# Patient Record
Sex: Female | Born: 1935 | Race: White | Hispanic: No | Smoking: Never smoker
Health system: Southern US, Community
[De-identification: ages and names within clinical notes are randomized; demographics above are authoritative.]

## PROBLEM LIST (undated history)

## (undated) DIAGNOSIS — I5030 Unspecified diastolic (congestive) heart failure: Secondary | ICD-10-CM

## (undated) DIAGNOSIS — I10 Essential (primary) hypertension: Secondary | ICD-10-CM

## (undated) DIAGNOSIS — F0280 Dementia in other diseases classified elsewhere without behavioral disturbance: Secondary | ICD-10-CM

## (undated) DIAGNOSIS — F0390 Unspecified dementia without behavioral disturbance: Secondary | ICD-10-CM

---

## 2014-04-24 ENCOUNTER — Emergency Department (HOSPITAL_COMMUNITY): Payer: Medicare Other

## 2014-04-24 ENCOUNTER — Encounter (HOSPITAL_COMMUNITY): Payer: Self-pay | Admitting: Emergency Medicine

## 2014-04-24 ENCOUNTER — Emergency Department (HOSPITAL_COMMUNITY)
Admission: EM | Admit: 2014-04-24 | Discharge: 2014-04-24 | Disposition: A | Discharge status: Home / Self Care | Payer: Medicare Other | Attending: Emergency Medicine | Admitting: Emergency Medicine

## 2014-04-24 DIAGNOSIS — R51 Headache: Secondary | ICD-10-CM | POA: Diagnosis not present

## 2014-04-24 DIAGNOSIS — W010XXA Fall on same level from slipping, tripping and stumbling without subsequent striking against object, initial encounter: Secondary | ICD-10-CM | POA: Insufficient documentation

## 2014-04-24 DIAGNOSIS — Z79899 Other long term (current) drug therapy: Secondary | ICD-10-CM | POA: Diagnosis not present

## 2014-04-24 DIAGNOSIS — Y9301 Activity, walking, marching and hiking: Secondary | ICD-10-CM | POA: Diagnosis not present

## 2014-04-24 DIAGNOSIS — S0990XA Unspecified injury of head, initial encounter: Secondary | ICD-10-CM | POA: Diagnosis not present

## 2014-04-24 DIAGNOSIS — R404 Transient alteration of awareness: Secondary | ICD-10-CM | POA: Diagnosis not present

## 2014-04-24 DIAGNOSIS — Y929 Unspecified place or not applicable: Secondary | ICD-10-CM | POA: Diagnosis not present

## 2014-04-24 DIAGNOSIS — S0003XA Contusion of scalp, initial encounter: Secondary | ICD-10-CM | POA: Diagnosis not present

## 2014-04-24 DIAGNOSIS — S1093XA Contusion of unspecified part of neck, initial encounter: Secondary | ICD-10-CM | POA: Diagnosis not present

## 2014-04-24 DIAGNOSIS — R42 Dizziness and giddiness: Secondary | ICD-10-CM | POA: Diagnosis not present

## 2014-04-24 NOTE — ED Notes (Signed)
Pt BIB EMS. Pt is from [place]. Pt states she slipped and fell on linoleum floor. Pt hit her head on the floor. Pt has small hematoma to R side of head. Pt states she had a headache earlier, but has resolved now. Pt c/o slight nausea with standing. Pt a/o x 4. No other injuries reported.

## 2014-04-24 NOTE — ED Notes (Signed)
Bed: WE99 Expected date: 04/24/14 Expected time: 4:19 PM Means of arrival: Ambulance Comments: Fall, bumped back of head No LOC

## 2014-04-24 NOTE — Discharge Instructions (Signed)
Return to the ED with any concerns including vomiting, seizure activity, decreased level of alertness/lethargy, or any other alarming symptoms   Emergency Department Resource Guide 1) Find a Doctor and Pay Out of Pocket Although you won't have to find out who is covered by your insurance plan, it is a good idea to ask around and get recommendations. You will then need to call the office and see if the doctor you have chosen will accept you as a new patient and what types of options they offer for patients who are self-pay. Some doctors offer discounts or will set up payment plans for their patients who do not have insurance, but you will need to ask so you aren't surprised when you get to your appointment.  2) Contact Your Local Health Department Not all health departments have doctors that can see patients for sick visits, but many do, so it is worth a call to see if yours does. If you don't know where your local health department is, you can check in your phone book. The CDC also has a tool to help you locate your state's health department, and many state websites also have listings of all of their local health departments.  3) Find a [facility] If your illness is not likely to be very severe or complicated, you may want to try a walk in clinic. These are popping up all over the country in pharmacies, drugstores, and shopping centers. They're usually staffed by nurse practitioners or physician assistants that have been trained to treat common illnesses and complaints. They're usually fairly quick and inexpensive. However, if you have serious medical issues or chronic medical problems, these are probably not your best option.  No Primary Care Doctor: - Call Health Connect at  [phone] - they can help you locate a primary care doctor that  accepts your insurance, provides certain services, etc. - Physician Referral Service- [phone]  Chronic Pain Problems: Organization          Address  Phone   Notes  [facility]  [phone] Patients need to be referred by their primary care doctor.   Medication Assistance: Organization         Address  Phone   Notes  [facility] [address] [place], [place] [address] [phone] --Must be a resident of [facility] -- Must have NO insurance coverage whatsoever (no Medicaid/ Medicare, etc.) -- The pt. MUST have a primary care doctor that directs their care regularly and follows them in the community   [facility]ist  [phone]   [employer]  [phone]    Agencies that provide inexpensive medical care: Organization         Address  Phone   Notes  [place]  [phone]   [facility]    [phone]   [facility] [place], [place] [address] [phone]   [place] [address], [place] [phone]   Planned Parenthood    [phone]   [facility]    [phone]   [place] and [address], [place] Phone:  [phone], Fax:  [phone] Hours of Operation:  9 am - 6 pm, M-F.  Also accepts Medicaid/Medicare and self-pay.  [facility] for [place] [address], [place] Phone: [phone], Fax: [phone])  [phone]. Hours of Operation:  8:30 am - 5:30 pm, M-F.  Also accepts Medicaid and self-pay.  [facility] [address] [place] Phone: [phone]   [place], [place], [place] [phone], Ext. [phone] Mondays & Thursdays: 7-9 AM.  First 15 patients are seen on a first come, first serve basis.    [place] Providers:  Organization         Address  Phone   Notes  [facility] [address], [place] [phone] Also accepts self-pay patients.  [facility] [address], [place]  [phone]   [place], [address], [place] [phone]   [facility] [address] [place] [phone]   Lucianne Lei [address], [place]   [phone] Only accepts [place] Access [place] patients after they have their name applied to their card.   Self-Pay (no insurance) in [facility]:  Organization         Address  Phone   Notes  Sickle Cell Patients, [facility] [place] [phone]   [facility] [phone]   [facility] [place]  [place], [place], [place] [phone]   [facility]/Dr. Osei-Bonsu  [address] [place] or [address], [place] [phone] Phone number for both [place] and [place] locations is the same.  [facility] [address], [place] [phone]   [facility] [address] [place] or [address] [phone]   [facility] [address], [place] [phone], phone; [phone], fax Sees patients 1st and 3rd Saturday of every month.  Must not qualify for public or private insurance (i.e. Medicaid, Medicare, [place] Health Choice, Veterans' Benefits)  Household income should be no more than 200% of the poverty level The clinic cannot treat you if you are pregnant or think you are pregnant  Sexually transmitted diseases are not treated at the clinic.    Dental Care: Organization         Address  Phone  Notes  [facility] Department of [facility] [place] [phone] Accepts children up to age 48 who are enrolled in [place] or [place]; pregnant women with a Medicaid card; and  children who have applied for Medicaid or [place] Health Choice, but were declined, whose parents can pay a reduced fee at time of service.  [facility] Department of [facility]  [address], [place] [phone] Accepts children up to age 27 who are enrolled in [place] or [place]; pregnant women with a Medicaid card; and children who have applied for Medicaid or [place] Health Choice, but were declined, whose parents can pay a reduced fee at time of service.  [place] Adult [facility] Access PROGRAM  [place] [phone] Patients are seen by appointment only. Walk-ins are not accepted. [place] will see patients 50 years of age and older. Monday - Tuesday (8am-5pm) Most Wednesdays (8:30-5pm) $30 per visit, cash only  [facility] Adult [employer] PROGRAM  [address], [place] [phone]  Patients are seen by appointment only. Walk-ins are not accepted. [place] will see patients 8 years of age and older. One Wednesday Evening (Monthly: Volunteer Based).  $30 per visit, cash only  [place]  [phone] for adults; Children under age 70, call Graduate Pediatric Dentistry at [phone]. Children aged 44-14, please call [phone] to request a pediatric application.  Dental services are provided in all areas of dental care including fillings, crowns and bridges, complete and partial dentures, implants, gum treatment, root canals, and extractions. Preventive care is also provided. Treatment is provided to both adults and children. Patients are selected via a lottery and there is often a waiting list.   [facility] [address] [place]  [phone] [email or web address]   [facility] [address] [place], [place] [phone], Ext. [phone] Second and Fourth Thursday of each month, opens at 6:30 AM; Clinic ends at 9 AM.  Patients are seen on a first-come first-served  basis, and a limited number are seen during each clinic.   [facility]  [address] [place] Danker [place], [place] [phone]   Eligibility Requirements You must have lived in [place], [place], or [place] counties for at least the last three months.   You cannot be eligible for state or federal sponsored [employer], including [employer], [place], or [employer].   You generally cannot be eligible for healthcare insurance through your employer.    How to apply: Eligibility screenings are held every Tuesday and Wednesday afternoon from 1:00 pm until 4:00 pm. You do not need an appointment for the interview!  [facility] [address], [place], [place]   [place]  [place]end Department  [place]  [phone]    Behavioral Health Resources in the Community: Intensive Outpatient Programs Organization         Address  Phone  Notes  [facility]. [address], [place], [place] [phone]   [facility] [address] [place], [place]   ADS: Alcohol & Drug Svcs [address] [place], [place]   [place] [address]  [place], [place] or [phone]   Substance Abuse Resources Organization         Address  Phone  Notes  Alcohol and Drug Services  [phone]   [place]  [phone]   [facility] [place]   Chinita Pester  [phone]   Residential & Outpatient Substance Abuse Program  [phone]   Psychological Services Organization         Address  Phone  Notes  [facility] [place]  Reserve  [phone]   [place] [address], [place] or [phone]    Mobile Crisis Teams Organization          Address  Phone  Notes  [facility], Mobile Crisis Care Unit  [phone]   [facility]otherapeutic [facility]  [address] [place], [place]   [place] [address], [place] [phone]    Self-Help/Support Groups Organization         Address  Phone             Notes  [place]. [facility] - variety of support groups  [place]  Call for more information  Narcotics Anonymous (NA), [facility] [address], [employer] [place]  2 meetings at this location   Residential Facilities manager         Address  Phone  Notes  [facility] [place],    [place]  [phone]   [facility]  [address] [place] [phone], [place], [place]   [place], [place] [phone] Admissions: 8am-3pm M-F  Incentives Substance [place] [address]    [place], [place] [phone]   [facility] [address] [place], [place], [place]   [facility] [address]  [place], [place]   [facility] Programs - Intensive Outpatient [address], [place], [place]   [facility] ([place].,  [place], [place] [phone] or [phone]   [facility] (RTS) [address], [place], [place] Accepts Medicaid  [facility] [place] [address],  [place] [phone] Substance Abuse/Addiction Treatment   [facility] Organization         Address  Phone  Notes  [facility]  [phone]   Domenic Schwab, PhD [address] [place]lis [place], [place]   [phone] or [phone]   [place], [place] [phone]   [facility] [address] [place], [place] [phone] Insurance/Medicaid/sponsorship  through [facility] Families [address], [address] [place]                                    [place], [place] [phone] [place] [address], [place] [phone]    Dr. Adele Schilder  [phone]   [facility] [place] Dept. 1) [address] [place] 2) [place] 3)  [place] of [place] [address], [place] [phone]  [phone]   [place] [phone] or [phone] (After Hours)

## 2014-04-24 NOTE — ED Provider Notes (Signed)
CSN: [id number]     Arrival date & time 04/24/14  1626 History   First MD Initiated Contact with Patient 04/24/14 1627     Chief Complaint  Patient presents with  . Fall  . Head Injury     (Consider location/radiation/quality/duration/timing/severity/associated sxs/prior Treatment) HPI Pt presents after slip and fall with minor head injury.  Pt states that she was walking and slipped on a wet floor at her facility.  She states she fell backwards and hit the back of her head.  No LOC, she has complete memory of the fall.  She c/o mild headache at this time. No nausea or vomiting, no seizure activity.  No pain in neck or extremities.  She has been able to ambulate normally since teh fall.  No chest pain or lightheadedness preceded the fall. There are no other associated systemic symptoms, there are no other alleviating or modifying factors.   History reviewed. No pertinent past medical history. History reviewed. No pertinent past surgical history. No family history on file. History  Substance Use Topics  . Smoking status: Not on file  . Smokeless tobacco: Not on file  . Alcohol Use: Not on file   OB History   Grav Para Term Preterm Abortions TAB SAB Ect Mult Living                 Review of Systems ROS reviewed and all otherwise negative except for mentioned in HPI    Allergies  Review of patient's allergies indicates no known allergies.  Home Medications   Prior to Admission medications   Medication Sig Start Date End Date Taking? Authorizing Provider  loratadine (CLARITIN) 10 MG tablet Take 10 mg by mouth daily.   Yes Historical Provider, MD  Multiple Vitamin (MULTIVITAMIN WITH MINERALS) TABS tablet Take 1 tablet by mouth daily.   Yes Historical Provider, MD  oxybutynin (DITROPAN) 5 MG tablet Take 5 mg by mouth 2 (two) times daily.   Yes Historical Provider, MD   BP 138/83  Pulse 67  Temp(Src) 98.3 F (36.8 C) (Oral)  Resp 16  SpO2 100% Vitals reviewed Physical  Exam Physical Examination: General appearance - alert, well appearing, and in no distress Mental status - alert, oriented to person, place, and time Head- NCAT, no hematoma/abrasion or laceration to scalp Eyes - no conjunctival injection, no scleral icterus Mouth - mucous membranes moist, pharynx normal without lesions Neck - no midline tenderness to palpation, FROM without pain Chest - clear to auscultation, no wheezes, rales or rhonchi, symmetric air entry Heart - normal rate, regular rhythm, normal S1, S2, no murmurs, rubs, clicks or gallops Abdomen - soft, nontender, nondistended, no masses or organomegaly Neurological - alert, oriented x 3, normal speech, cranial nerves 2-12 tested and intact, strength 5/5 in extremities x 4, sensation intact Extremities - peripheral pulses normal, no pedal edema, no clubbing or cyanosis Skin - normal coloration and turgor, no rashes  ED Course  Procedures (including critical care time) Labs Review Labs Reviewed - No data to display  Imaging Review Ct Head Wo Contrast  04/24/2014   CLINICAL DATA:  Fall. Head injury. Small right-sided hematoma on head. Slight nausea. Prior headache, since resolved.  EXAM: CT HEAD WITHOUT CONTRAST  TECHNIQUE: Contiguous axial images were obtained from the base of the skull through the vertex without intravenous contrast.  COMPARISON:  None.  FINDINGS: There is no evidence of acute cortical infarct, intracranial hemorrhage, mass, midline shift, or extra-axial fluid collection. There is mild-to-moderate generalized  cerebral atrophy. Periventricular white-matter hypodensities are nonspecific but compatible with mild chronic small vessel ischemic disease.  Prior bilateral cataract extraction is noted. Mastoid air cells are clear. No skull fracture is identified. Moderate circumferential left sphenoid sinus mucosal thickening and rightward nasal septal deviation and spurring are noted.  IMPRESSION: 1. No acute intracranial  abnormality. 2. Mild chronic small vessel ischemic disease and cerebral atrophy.   Electronically Signed   By: Logan Bores   On: 04/24/2014 17:54     EKG Interpretation None      MDM   Final diagnoses:  Minor head injury, initial encounter    Pt presenting with headache after mechanical fall, slipped and hit head falling backwards.  No neck pain or tenderness.  Pt with normal neuro exam. Not on blood thinners.  Head CT reassuring.    Nursing notes including past medical history and social history reviewed and considered in documentation Prior records reviewed and considered during this visit     Threasa Beards, MD 04/24/14 2022

## 2014-04-24 NOTE — ED Notes (Signed)
Patient transported to CT 

## 2014-07-12 DIAGNOSIS — R350 Frequency of micturition: Secondary | ICD-10-CM | POA: Diagnosis not present

## 2014-07-12 DIAGNOSIS — Z538 Procedure and treatment not carried out for other reasons: Secondary | ICD-10-CM | POA: Diagnosis not present

## 2014-07-12 DIAGNOSIS — G301 Alzheimer's disease with late onset: Secondary | ICD-10-CM | POA: Diagnosis not present

## 2014-07-12 DIAGNOSIS — R51 Headache: Secondary | ICD-10-CM | POA: Diagnosis not present

## 2014-07-21 DIAGNOSIS — I10 Essential (primary) hypertension: Secondary | ICD-10-CM | POA: Diagnosis not present

## 2014-07-21 DIAGNOSIS — Z1389 Encounter for screening for other disorder: Secondary | ICD-10-CM | POA: Diagnosis not present

## 2014-07-21 DIAGNOSIS — N8110 Cystocele, unspecified: Secondary | ICD-10-CM | POA: Diagnosis not present

## 2014-07-28 DIAGNOSIS — G301 Alzheimer's disease with late onset: Secondary | ICD-10-CM | POA: Diagnosis not present

## 2014-07-28 DIAGNOSIS — N819 Female genital prolapse, unspecified: Secondary | ICD-10-CM | POA: Diagnosis not present

## 2014-07-28 DIAGNOSIS — N952 Postmenopausal atrophic vaginitis: Secondary | ICD-10-CM | POA: Diagnosis not present

## 2014-07-28 DIAGNOSIS — N8110 Cystocele, unspecified: Secondary | ICD-10-CM | POA: Diagnosis not present

## 2014-08-05 DIAGNOSIS — N8111 Cystocele, midline: Secondary | ICD-10-CM | POA: Diagnosis not present

## 2014-08-05 DIAGNOSIS — N393 Stress incontinence (female) (male): Secondary | ICD-10-CM | POA: Diagnosis not present

## 2014-08-05 DIAGNOSIS — N3281 Overactive bladder: Secondary | ICD-10-CM | POA: Diagnosis not present

## 2014-08-24 DIAGNOSIS — R32 Unspecified urinary incontinence: Secondary | ICD-10-CM | POA: Diagnosis not present

## 2014-08-24 DIAGNOSIS — G301 Alzheimer's disease with late onset: Secondary | ICD-10-CM | POA: Diagnosis not present

## 2014-08-24 DIAGNOSIS — I10 Essential (primary) hypertension: Secondary | ICD-10-CM | POA: Diagnosis not present

## 2014-11-11 DIAGNOSIS — N899 Noninflammatory disorder of vagina, unspecified: Secondary | ICD-10-CM | POA: Diagnosis not present

## 2014-11-11 DIAGNOSIS — I872 Venous insufficiency (chronic) (peripheral): Secondary | ICD-10-CM | POA: Diagnosis not present

## 2014-11-11 DIAGNOSIS — N952 Postmenopausal atrophic vaginitis: Secondary | ICD-10-CM | POA: Diagnosis not present

## 2014-11-11 DIAGNOSIS — G301 Alzheimer's disease with late onset: Secondary | ICD-10-CM | POA: Diagnosis not present

## 2014-11-11 DIAGNOSIS — I10 Essential (primary) hypertension: Secondary | ICD-10-CM | POA: Diagnosis not present

## 2014-11-11 DIAGNOSIS — N819 Female genital prolapse, unspecified: Secondary | ICD-10-CM | POA: Diagnosis not present

## 2014-11-11 DIAGNOSIS — T836XXA Infection and inflammatory reaction due to prosthetic device, implant and graft in genital tract, initial encounter: Secondary | ICD-10-CM | POA: Diagnosis not present

## 2014-12-09 DIAGNOSIS — N3281 Overactive bladder: Secondary | ICD-10-CM | POA: Diagnosis not present

## 2014-12-09 DIAGNOSIS — R3915 Urgency of urination: Secondary | ICD-10-CM | POA: Diagnosis not present

## 2014-12-09 DIAGNOSIS — N8111 Cystocele, midline: Secondary | ICD-10-CM | POA: Diagnosis not present

## 2015-01-07 DIAGNOSIS — N814 Uterovaginal prolapse, unspecified: Secondary | ICD-10-CM | POA: Diagnosis not present

## 2015-01-07 DIAGNOSIS — Z85820 Personal history of malignant melanoma of skin: Secondary | ICD-10-CM | POA: Diagnosis not present

## 2015-01-07 DIAGNOSIS — Z90710 Acquired absence of both cervix and uterus: Secondary | ICD-10-CM | POA: Diagnosis not present

## 2015-01-07 DIAGNOSIS — R32 Unspecified urinary incontinence: Secondary | ICD-10-CM | POA: Diagnosis not present

## 2015-01-07 DIAGNOSIS — N8111 Cystocele, midline: Secondary | ICD-10-CM | POA: Diagnosis not present

## 2015-01-07 DIAGNOSIS — G309 Alzheimer's disease, unspecified: Secondary | ICD-10-CM | POA: Diagnosis not present

## 2015-01-07 DIAGNOSIS — N8110 Cystocele, unspecified: Secondary | ICD-10-CM | POA: Diagnosis not present

## 2015-01-07 DIAGNOSIS — F0280 Dementia in other diseases classified elsewhere without behavioral disturbance: Secondary | ICD-10-CM | POA: Diagnosis not present

## 2015-01-07 DIAGNOSIS — N952 Postmenopausal atrophic vaginitis: Secondary | ICD-10-CM | POA: Diagnosis not present

## 2015-01-07 DIAGNOSIS — Z79899 Other long term (current) drug therapy: Secondary | ICD-10-CM | POA: Diagnosis not present

## 2015-01-08 DIAGNOSIS — Z79899 Other long term (current) drug therapy: Secondary | ICD-10-CM | POA: Diagnosis not present

## 2015-01-08 DIAGNOSIS — Z85820 Personal history of malignant melanoma of skin: Secondary | ICD-10-CM | POA: Diagnosis not present

## 2015-01-08 DIAGNOSIS — N952 Postmenopausal atrophic vaginitis: Secondary | ICD-10-CM | POA: Diagnosis not present

## 2015-01-08 DIAGNOSIS — N814 Uterovaginal prolapse, unspecified: Secondary | ICD-10-CM | POA: Diagnosis not present

## 2015-01-08 DIAGNOSIS — Z90710 Acquired absence of both cervix and uterus: Secondary | ICD-10-CM | POA: Diagnosis not present

## 2015-01-08 DIAGNOSIS — R32 Unspecified urinary incontinence: Secondary | ICD-10-CM | POA: Diagnosis not present

## 2015-02-10 DIAGNOSIS — G301 Alzheimer's disease with late onset: Secondary | ICD-10-CM | POA: Diagnosis not present

## 2015-02-10 DIAGNOSIS — I10 Essential (primary) hypertension: Secondary | ICD-10-CM | POA: Diagnosis not present

## 2015-02-10 DIAGNOSIS — I872 Venous insufficiency (chronic) (peripheral): Secondary | ICD-10-CM | POA: Diagnosis not present

## 2015-05-04 DIAGNOSIS — Z1389 Encounter for screening for other disorder: Secondary | ICD-10-CM | POA: Diagnosis not present

## 2015-05-04 DIAGNOSIS — M7072 Other bursitis of hip, left hip: Secondary | ICD-10-CM | POA: Diagnosis not present

## 2015-05-04 DIAGNOSIS — G301 Alzheimer's disease with late onset: Secondary | ICD-10-CM | POA: Diagnosis not present

## 2015-05-04 DIAGNOSIS — M549 Dorsalgia, unspecified: Secondary | ICD-10-CM | POA: Diagnosis not present

## 2015-05-04 DIAGNOSIS — I878 Other specified disorders of veins: Secondary | ICD-10-CM | POA: Diagnosis not present

## 2015-05-04 DIAGNOSIS — Z0000 Encounter for general adult medical examination without abnormal findings: Secondary | ICD-10-CM | POA: Diagnosis not present

## 2015-05-04 DIAGNOSIS — R011 Cardiac murmur, unspecified: Secondary | ICD-10-CM | POA: Diagnosis not present

## 2015-05-04 DIAGNOSIS — Z79899 Other long term (current) drug therapy: Secondary | ICD-10-CM | POA: Diagnosis not present

## 2015-05-04 DIAGNOSIS — I10 Essential (primary) hypertension: Secondary | ICD-10-CM | POA: Diagnosis not present

## 2015-05-05 ENCOUNTER — Other Ambulatory Visit (HOSPITAL_COMMUNITY): Payer: Self-pay | Admitting: Geriatric Medicine

## 2015-05-05 DIAGNOSIS — R011 Cardiac murmur, unspecified: Secondary | ICD-10-CM

## 2015-05-10 ENCOUNTER — Other Ambulatory Visit (HOSPITAL_COMMUNITY): Payer: PRIVATE HEALTH INSURANCE

## 2015-05-16 ENCOUNTER — Ambulatory Visit (HOSPITAL_COMMUNITY): Payer: Medicare Other | Attending: Cardiology

## 2015-05-16 ENCOUNTER — Other Ambulatory Visit: Payer: Self-pay

## 2015-05-16 DIAGNOSIS — I517 Cardiomegaly: Secondary | ICD-10-CM | POA: Diagnosis not present

## 2015-05-16 DIAGNOSIS — I358 Other nonrheumatic aortic valve disorders: Secondary | ICD-10-CM | POA: Insufficient documentation

## 2015-05-16 DIAGNOSIS — I340 Nonrheumatic mitral (valve) insufficiency: Secondary | ICD-10-CM | POA: Insufficient documentation

## 2015-05-16 DIAGNOSIS — R011 Cardiac murmur, unspecified: Secondary | ICD-10-CM

## 2015-06-02 DIAGNOSIS — Z23 Encounter for immunization: Secondary | ICD-10-CM | POA: Diagnosis not present

## 2015-07-21 DIAGNOSIS — M62830 Muscle spasm of back: Secondary | ICD-10-CM | POA: Diagnosis not present

## 2015-11-02 DIAGNOSIS — G301 Alzheimer's disease with late onset: Secondary | ICD-10-CM | POA: Diagnosis not present

## 2015-11-02 DIAGNOSIS — I10 Essential (primary) hypertension: Secondary | ICD-10-CM | POA: Diagnosis not present

## 2015-11-02 DIAGNOSIS — R51 Headache: Secondary | ICD-10-CM | POA: Diagnosis not present

## 2015-11-02 DIAGNOSIS — L821 Other seborrheic keratosis: Secondary | ICD-10-CM | POA: Diagnosis not present

## 2015-11-02 DIAGNOSIS — K921 Melena: Secondary | ICD-10-CM | POA: Diagnosis not present

## 2016-05-07 DIAGNOSIS — Z23 Encounter for immunization: Secondary | ICD-10-CM | POA: Diagnosis not present

## 2016-05-07 DIAGNOSIS — H6121 Impacted cerumen, right ear: Secondary | ICD-10-CM | POA: Diagnosis not present

## 2016-05-07 DIAGNOSIS — Z0000 Encounter for general adult medical examination without abnormal findings: Secondary | ICD-10-CM | POA: Diagnosis not present

## 2016-05-07 DIAGNOSIS — Z1389 Encounter for screening for other disorder: Secondary | ICD-10-CM | POA: Diagnosis not present

## 2016-05-07 DIAGNOSIS — Z79899 Other long term (current) drug therapy: Secondary | ICD-10-CM | POA: Diagnosis not present

## 2016-05-07 DIAGNOSIS — L989 Disorder of the skin and subcutaneous tissue, unspecified: Secondary | ICD-10-CM | POA: Diagnosis not present

## 2016-05-07 DIAGNOSIS — I10 Essential (primary) hypertension: Secondary | ICD-10-CM | POA: Diagnosis not present

## 2016-05-07 DIAGNOSIS — G301 Alzheimer's disease with late onset: Secondary | ICD-10-CM | POA: Diagnosis not present

## 2016-07-06 DIAGNOSIS — Z85828 Personal history of other malignant neoplasm of skin: Secondary | ICD-10-CM | POA: Diagnosis not present

## 2016-07-06 DIAGNOSIS — C44311 Basal cell carcinoma of skin of nose: Secondary | ICD-10-CM | POA: Diagnosis not present

## 2016-08-14 DIAGNOSIS — G301 Alzheimer's disease with late onset: Secondary | ICD-10-CM | POA: Diagnosis not present

## 2016-08-14 DIAGNOSIS — I10 Essential (primary) hypertension: Secondary | ICD-10-CM | POA: Diagnosis not present

## 2016-11-13 DIAGNOSIS — G301 Alzheimer's disease with late onset: Secondary | ICD-10-CM | POA: Diagnosis not present

## 2016-11-13 DIAGNOSIS — I10 Essential (primary) hypertension: Secondary | ICD-10-CM | POA: Diagnosis not present

## 2016-11-13 DIAGNOSIS — H6123 Impacted cerumen, bilateral: Secondary | ICD-10-CM | POA: Diagnosis not present

## 2016-11-13 DIAGNOSIS — R42 Dizziness and giddiness: Secondary | ICD-10-CM | POA: Diagnosis not present

## 2016-12-25 DIAGNOSIS — H900 Conductive hearing loss, bilateral: Secondary | ICD-10-CM | POA: Diagnosis not present

## 2016-12-25 DIAGNOSIS — I10 Essential (primary) hypertension: Secondary | ICD-10-CM | POA: Diagnosis not present

## 2016-12-25 DIAGNOSIS — G301 Alzheimer's disease with late onset: Secondary | ICD-10-CM | POA: Diagnosis not present

## 2016-12-25 DIAGNOSIS — L989 Disorder of the skin and subcutaneous tissue, unspecified: Secondary | ICD-10-CM | POA: Diagnosis not present

## 2017-01-17 DIAGNOSIS — Z85828 Personal history of other malignant neoplasm of skin: Secondary | ICD-10-CM | POA: Diagnosis not present

## 2017-01-17 DIAGNOSIS — L905 Scar conditions and fibrosis of skin: Secondary | ICD-10-CM | POA: Diagnosis not present

## 2017-05-15 DIAGNOSIS — Z79899 Other long term (current) drug therapy: Secondary | ICD-10-CM | POA: Diagnosis not present

## 2017-05-15 DIAGNOSIS — Z23 Encounter for immunization: Secondary | ICD-10-CM | POA: Diagnosis not present

## 2017-05-15 DIAGNOSIS — I10 Essential (primary) hypertension: Secondary | ICD-10-CM | POA: Diagnosis not present

## 2017-05-15 DIAGNOSIS — G301 Alzheimer's disease with late onset: Secondary | ICD-10-CM | POA: Diagnosis not present

## 2017-05-15 DIAGNOSIS — Z1389 Encounter for screening for other disorder: Secondary | ICD-10-CM | POA: Diagnosis not present

## 2017-05-15 DIAGNOSIS — Z0000 Encounter for general adult medical examination without abnormal findings: Secondary | ICD-10-CM | POA: Diagnosis not present

## 2017-08-28 DIAGNOSIS — I10 Essential (primary) hypertension: Secondary | ICD-10-CM | POA: Diagnosis not present

## 2017-08-28 DIAGNOSIS — G301 Alzheimer's disease with late onset: Secondary | ICD-10-CM | POA: Diagnosis not present

## 2017-11-26 DIAGNOSIS — G301 Alzheimer's disease with late onset: Secondary | ICD-10-CM | POA: Diagnosis not present

## 2017-11-26 DIAGNOSIS — I10 Essential (primary) hypertension: Secondary | ICD-10-CM | POA: Diagnosis not present

## 2017-11-26 DIAGNOSIS — Z79899 Other long term (current) drug therapy: Secondary | ICD-10-CM | POA: Diagnosis not present

## 2018-05-21 DIAGNOSIS — G301 Alzheimer's disease with late onset: Secondary | ICD-10-CM | POA: Diagnosis not present

## 2018-05-21 DIAGNOSIS — Z23 Encounter for immunization: Secondary | ICD-10-CM | POA: Diagnosis not present

## 2018-05-21 DIAGNOSIS — Z79899 Other long term (current) drug therapy: Secondary | ICD-10-CM | POA: Diagnosis not present

## 2018-05-21 DIAGNOSIS — Z0000 Encounter for general adult medical examination without abnormal findings: Secondary | ICD-10-CM | POA: Diagnosis not present

## 2018-05-21 DIAGNOSIS — I10 Essential (primary) hypertension: Secondary | ICD-10-CM | POA: Diagnosis not present

## 2018-05-21 DIAGNOSIS — Z7189 Other specified counseling: Secondary | ICD-10-CM | POA: Diagnosis not present

## 2018-07-21 DIAGNOSIS — Z79899 Other long term (current) drug therapy: Secondary | ICD-10-CM | POA: Diagnosis not present

## 2019-06-01 DIAGNOSIS — Z1389 Encounter for screening for other disorder: Secondary | ICD-10-CM | POA: Diagnosis not present

## 2019-06-01 DIAGNOSIS — Z0000 Encounter for general adult medical examination without abnormal findings: Secondary | ICD-10-CM | POA: Diagnosis not present

## 2019-06-01 DIAGNOSIS — I10 Essential (primary) hypertension: Secondary | ICD-10-CM | POA: Diagnosis not present

## 2019-06-01 DIAGNOSIS — G301 Alzheimer's disease with late onset: Secondary | ICD-10-CM | POA: Diagnosis not present

## 2019-06-04 DIAGNOSIS — Z23 Encounter for immunization: Secondary | ICD-10-CM | POA: Diagnosis not present

## 2019-06-27 DIAGNOSIS — Z20828 Contact with and (suspected) exposure to other viral communicable diseases: Secondary | ICD-10-CM | POA: Diagnosis not present

## 2019-09-14 ENCOUNTER — Emergency Department (HOSPITAL_COMMUNITY): Payer: Medicare Other

## 2019-09-14 ENCOUNTER — Encounter (HOSPITAL_COMMUNITY): Payer: Self-pay | Admitting: Emergency Medicine

## 2019-09-14 ENCOUNTER — Emergency Department (HOSPITAL_COMMUNITY)
Admission: EM | Admit: 2019-09-14 | Discharge: 2019-09-15 | Disposition: A | Discharge status: Home / Self Care | Payer: Medicare Other | Attending: Emergency Medicine | Admitting: Emergency Medicine

## 2019-09-14 DIAGNOSIS — Z79899 Other long term (current) drug therapy: Secondary | ICD-10-CM | POA: Diagnosis not present

## 2019-09-14 DIAGNOSIS — I10 Essential (primary) hypertension: Secondary | ICD-10-CM | POA: Diagnosis not present

## 2019-09-14 DIAGNOSIS — U071 COVID-19: Secondary | ICD-10-CM | POA: Insufficient documentation

## 2019-09-14 DIAGNOSIS — I4891 Unspecified atrial fibrillation: Secondary | ICD-10-CM | POA: Insufficient documentation

## 2019-09-14 DIAGNOSIS — R1084 Generalized abdominal pain: Secondary | ICD-10-CM | POA: Diagnosis not present

## 2019-09-14 DIAGNOSIS — R509 Fever, unspecified: Secondary | ICD-10-CM | POA: Diagnosis not present

## 2019-09-14 DIAGNOSIS — R05 Cough: Secondary | ICD-10-CM | POA: Diagnosis not present

## 2019-09-14 DIAGNOSIS — R109 Unspecified abdominal pain: Secondary | ICD-10-CM | POA: Diagnosis present

## 2019-09-14 DIAGNOSIS — R000 Tachycardia, unspecified: Secondary | ICD-10-CM | POA: Diagnosis not present

## 2019-09-14 DIAGNOSIS — N3090 Cystitis, unspecified without hematuria: Secondary | ICD-10-CM | POA: Diagnosis not present

## 2019-09-14 DIAGNOSIS — F0390 Unspecified dementia without behavioral disturbance: Secondary | ICD-10-CM | POA: Diagnosis not present

## 2019-09-14 DIAGNOSIS — I499 Cardiac arrhythmia, unspecified: Secondary | ICD-10-CM | POA: Diagnosis not present

## 2019-09-14 HISTORY — DX: Essential (primary) hypertension: I10

## 2019-09-14 HISTORY — DX: Unspecified dementia, unspecified severity, without behavioral disturbance, psychotic disturbance, mood disturbance, and anxiety: F03.90

## 2019-09-14 LAB — BASIC METABOLIC PANEL
Anion gap: 10 (ref 5–15)
BUN: 17 mg/dL (ref 8–23)
CO2: 21 mmol/L — ABNORMAL LOW (ref 22–32)
Calcium: 8 mg/dL — ABNORMAL LOW (ref 8.9–10.3)
Chloride: 109 mmol/L (ref 98–111)
Creatinine, Ser: 0.83 mg/dL (ref 0.44–1.00)
GFR calc Af Amer: >60 mL/min (ref >60)
GFR calc non Af Amer: >60 mL/min (ref >60)
Glucose, Bld: 113 mg/dL — ABNORMAL HIGH (ref 70–99)
Potassium: 3.7 mmol/L (ref 3.5–5.1)
Sodium: 140 mmol/L (ref 135–145)

## 2019-09-14 LAB — POC SARS CORONAVIRUS 2 AG -  ED: SARS Coronavirus 2 Ag: POSITIVE — AB

## 2019-09-14 LAB — HEPATIC FUNCTION PANEL
ALT: 22 U/L (ref 0–44)
AST: 40 U/L (ref 15–41)
Albumin: 3.2 g/dL — ABNORMAL LOW (ref 3.5–5.0)
Alkaline Phosphatase: 65 U/L (ref 38–126)
Bilirubin, Direct: 0.4 mg/dL — ABNORMAL HIGH (ref 0.0–0.2)
Indirect Bilirubin: 1.1 mg/dL — ABNORMAL HIGH (ref 0.3–0.9)
Total Bilirubin: 1.5 mg/dL — ABNORMAL HIGH (ref 0.3–1.2)
Total Protein: 6.4 g/dL — ABNORMAL LOW (ref 6.5–8.1)

## 2019-09-14 LAB — LIPASE, BLOOD: Lipase: 28 U/L (ref 11–51)

## 2019-09-14 LAB — CBC
HCT: 43.1 % (ref 36.0–46.0)
Hemoglobin: 14.3 g/dL (ref 12.0–15.0)
MCH: 29.1 pg (ref 26.0–34.0)
MCHC: 33.2 g/dL (ref 30.0–36.0)
MCV: 87.8 fL (ref 80.0–100.0)
Platelets: 226 10*3/uL (ref 150–400)
RBC: 4.91 MIL/uL (ref 3.87–5.11)
RDW: 13.5 % (ref 11.5–15.5)
WBC: 5.6 10*3/uL (ref 4.0–10.5)
nRBC: 0 % (ref 0.0–0.2)

## 2019-09-14 LAB — TROPONIN I (HIGH SENSITIVITY)
Troponin I (High Sensitivity): 12 ng/L (ref <18)
Troponin I (High Sensitivity): 14 ng/L (ref <18)

## 2019-09-14 MED ORDER — SODIUM CHLORIDE 0.9 % IV BOLUS
1000.0000 mL | Freq: Once | INTRAVENOUS | Status: AC
  Administered 2019-09-14: 1000 mL via INTRAVENOUS

## 2019-09-14 NOTE — ED Provider Notes (Signed)
[facility] EMERGENCY DEPARTMENT Provider Note   CSN: [id number] Arrival date & time: 09/14/19  1923     History Chief Complaint  Patient presents with  . Abdominal Pain    Dawn Buchanan is a 86 y.o. female.  HPI     Level 5 caveat due to dementia.  Dawn Buchanan is a 86 y.o. female, with a history of HTN and dementia, presenting to the ED with elevated heart rate.  She was also reportedly feeling dizzy and having chest discomfort when she called for EMS, however, patient states she does not remember this.  EMS noted A. fib on the monitor at a rate up to 170.  She was treated with Cardizem and 500 cc fluid bolus.  This improved her rate down to near 100.  Patient denies fever/chills, chest pain, shortness of breath, cough, abdominal pain, N/V/D, urinary symptoms, or any other complaints.   Past Medical History:  Diagnosis Date  . Dementia ([place])   . Hypertension     There are no problems to display for this patient.   History reviewed. No pertinent surgical history.   OB History   No obstetric history on file.     History reviewed. No pertinent family history.  Social History   Tobacco Use  . Smoking status: Never Smoker  . Smokeless tobacco: Never Used  Substance Use Topics  . Alcohol use: Not on file  . Drug use: Not on file    Home Medications Prior to Admission medications   Medication Sig Start Date End Date Taking? Authorizing Provider  cephALEXin (KEFLEX) 500 MG capsule Take 1 capsule (500 mg total) by mouth 2 (two) times daily for 5 days. 09/15/19 09/20/19  Ninnie Fein C, PA-C  loratadine (CLARITIN) 10 MG tablet Take 10 mg by mouth daily.    [provider]  Multiple Vitamin (MULTIVITAMIN WITH MINERALS) TABS tablet Take 1 tablet by mouth daily.    [provider]  oxybutynin (DITROPAN) 5 MG tablet Take 5 mg by mouth 2 (two) times daily.    [provider]    Allergies    Patient has no known  allergies.  Review of Systems   Review of Systems  Unable to perform ROS: Dementia    Physical Exam Updated Vital Signs BP (!) 141/75 (BP Location: Left Arm)   Pulse (!) 104   Temp 99 F (37.2 C) (Oral)   Resp 18   Ht 5\' 3"  (1.6 m)   Wt 59 kg   SpO2 97%   BMI 23.03 kg/m   Physical Exam Vitals and nursing note reviewed.  Constitutional:      General: She is not in acute distress.    Appearance: She is well-developed. She is not diaphoretic.  HENT:     Head: Normocephalic and atraumatic.     Mouth/Throat:     Mouth: Mucous membranes are moist.     Pharynx: Oropharynx is clear.  Eyes:     Conjunctiva/sclera: Conjunctivae normal.  Cardiovascular:     Rate and Rhythm: Tachycardia present. Rhythm irregularly irregular.     Pulses: Normal pulses.          Radial pulses are 2+ on the right side and 2+ on the left side.       Posterior tibial pulses are 2+ on the right side and 2+ on the left side.     Heart sounds: Normal heart sounds.     Comments: Tactile temperature in the extremities appropriate and  equal bilaterally. Slightly tachycardic at around 102. Pulmonary:     Effort: Pulmonary effort is normal. No respiratory distress.     Breath sounds: Normal breath sounds.  Abdominal:     Palpations: Abdomen is soft.     Tenderness: There is no abdominal tenderness. There is no guarding.  Musculoskeletal:     Cervical back: Neck supple.     Right lower leg: No edema.     Left lower leg: No edema.  Lymphadenopathy:     Cervical: No cervical adenopathy.  Skin:    General: Skin is warm and dry.  Neurological:     Mental Status: She is alert.  Psychiatric:        Mood and Affect: Mood and affect normal.        Speech: Speech normal.        Behavior: Behavior normal.     ED Results / Procedures / Treatments   Labs (all labs ordered are listed, but only abnormal results are displayed) Labs Reviewed  BASIC METABOLIC PANEL - Abnormal; Notable for the following  components:      Result Value   CO2 21 (*)    Glucose, Bld 113 (*)    Calcium 8.0 (*)    All other components within normal limits  HEPATIC FUNCTION PANEL - Abnormal; Notable for the following components:   Total Protein 6.4 (*)    Albumin 3.2 (*)    Total Bilirubin 1.5 (*)    Bilirubin, Direct 0.4 (*)    Indirect Bilirubin 1.1 (*)    All other components within normal limits  URINALYSIS, ROUTINE W REFLEX MICROSCOPIC - Abnormal; Notable for the following components:   APPearance HAZY (*)    Ketones, ur 20 (*)    Nitrite POSITIVE (*)    Bacteria, UA MANY (*)    All other components within normal limits  POC SARS CORONAVIRUS 2 AG -  ED - Abnormal; Notable for the following components:   SARS Coronavirus 2 Ag POSITIVE (*)    All other components within normal limits  URINE CULTURE  CBC  LIPASE, BLOOD  TROPONIN I (HIGH SENSITIVITY)  TROPONIN I (HIGH SENSITIVITY)    EKG EKG Interpretation  Date/Time:  Monday September 14 2019 19:29:27 EST Ventricular Rate:  87 PR Interval:    QRS Duration: 102 QT Interval:  380 QTC Calculation: 447 R Axis:   84 Text Interpretation: Atrial fibrillation Borderline right axis deviation No old tracing to compare Confirmed by Malvin Johns [id number]) on 09/14/2019 7:50:46 PM   Radiology DG Chest Portable 1 View  Result Date: 09/14/2019 CLINICAL DATA:  Cough, fever, and diarrhea. EXAM: PORTABLE CHEST 1 VIEW COMPARISON:  None. FINDINGS: The cardiac silhouette is mildly enlarged. There is mild peribronchial thickening with mildly accentuated interstitial markings bilaterally. No confluent airspace opacity, overt pulmonary edema, sizable pleural effusion, or pneumothorax is identified. No acute osseous abnormality is seen. IMPRESSION: Prominent interstitial markings bilaterally which could reflect acute atypical infection although underlying chronic bronchitis is also possible given lack of prior studies for comparison. Electronically Signed   By: Logan Bores M.D.   On: 09/14/2019 20:57    Procedures Procedures (including critical care time)  Medications Ordered in ED Medications  cephALEXin (KEFLEX) capsule 500 mg (has no administration in time range)  sodium chloride 0.9 % bolus 1,000 mL (1,000 mLs Intravenous New Bag/Given 09/14/19 2231)    ED Course  I have reviewed the triage vital signs and the nursing notes.  Pertinent labs &  imaging results that were available during my care of the patient were reviewed by me and considered in my medical decision making (see chart for details).  Clinical Course as of Sep 14 33  Mon Sep 14, 2019  2125 Spoke with patient's son and POA, [name]. Patient is in independent living. She has pretty serious dementia and will forget conversations shortly after she has them. Diarrhea Saturday.  She rarely drinks water at baseline. No history of Afib. EMS was called because she was complaining of dizziness and chest pain today.    [SJ]  2208 Spoke with Dr. Paticia Stack, cardiology fellow. States if patient is otherwise stable, she can be discharged with close follow-up in the A. fib clinic. Although with a CHA2DS2-VASc score of 4 the patient would typically be anticoagulated, due to the patient's age and risk for falls, he would recommend speaking with the family as cardiology would typically not anticoagulate somebody with these risk factors and at this age.     [SJ]  2309 Updated patient's son on additional findings, including positive Covid test.  Discussed anticoagulation, including risks and benefits.  He agrees with decision to forego anticoagulation.   [SJ]    Clinical Course User Index [SJ] Azazel Franze C, PA-C   MDM Rules/Calculators/A&P     CHA2DS2-VASc Score: 4                 Patient presents with elevated heart rate.  Noted to be in A. fib RVR.  Improved with Cardizem and IV fluids.  Her heart rate maintained around 100 during her ED course. Patient is nontoxic appearing, afebrile, not  tachypneic, not hypotensive, maintains excellent SPO2 on room air, and is in no apparent distress.  Ketonuria, mildly elevated bilirubin, and mildly decreased CO2 are suggestive of dehydration.  Patient had no vomiting or diarrhea during her ED course. Patient really had no complaints during her ED course. Covid positive. UA suggestive of infection.  Urine culture pending. Patient given referral to A. fib clinic.  Patient was evaluated for anticoagulation therapy, however, due to her age, current living situation, and history of previous falls, shared decision-making discussion was had with the patient's son/POA and decision was made to forgo anticoagulation therapy at this time.  Findings and plan of care discussed with Malvin Johns, MD. Dr. Tamera Punt personally evaluated and examined this patient.  Vitals:   09/14/19 2200 09/14/19 2300 09/14/19 2342 09/15/19 0000  BP:  (!) 164/103 (!) 170/103 (!) 150/112  Pulse: 99 (!) 106 (!) 112 (!) 101  Resp: 17 (!) 22 20 (!) 26  Temp:      TempSrc:      SpO2: 98% 97% 98% 98%  Weight:      Height:           Final Clinical Impression(s) / ED Diagnoses Final diagnoses:  New onset a-fib ([place])  COVID-19  Cystitis    Rx / DC Orders ED Discharge Orders         Ordered    Amb Referral to AFIB Clinic     09/15/19 0012    cephALEXin (KEFLEX) 500 MG capsule  2 times daily     09/15/19 0013           Lorayne Bender, PA-C 09/15/19 0039    Malvin Johns, MD 09/16/19 1131

## 2019-09-14 NOTE — Discharge Instructions (Addendum)
Atrial fibrillation We noted you have an arrhythmia called atrial fibrillation.  You will need to be further managed by the atrial fibrillation clinic.  A referral has already been made for you.  Urinary tract infection There is evidence of possible urinary tract infection on the urinalysis. Please take all of your antibiotics until finished!   You may develop abdominal discomfort or diarrhea from the antibiotic.  You may help offset this with probiotics which you can buy or get in yogurt. Do not eat or take the probiotics until 2 hours after your antibiotic.    COVID-19 isolation recommendations  Patients who have symptoms consistent with COVID-19 should self isolate until: At least 3 days (72 hours) have passed since recovery, defined as resolution of fever without the use of fever reducing medications and improvement in respiratory symptoms (e.g., cough, shortness of breath), and At least 7 days have passed since symptoms first appeared. Retesting is not required and not recommended as patients can continue to test positive for several weeks despite lack of symptoms.

## 2019-09-14 NOTE — ED Triage Notes (Signed)
Pt arrived via [facility] from [place] for epigastric painx1 hr. Diarrheax2 days, decreased PO intake. Per EMS pt was in A-fib w/RVR. HR 90-170. Pt is A&Ox1 to self only. EMS gave NS 540ml and cardizem 10mg  and it brought heart rate down to 80-120. Pt is A-fib on monitor. VS WNL

## 2019-09-14 NOTE — ED Notes (Signed)
Patient son Dawn Buchanan calling asking for an update [phone] please call when we can or if we have any questions

## 2019-09-15 DIAGNOSIS — U071 COVID-19: Secondary | ICD-10-CM | POA: Diagnosis not present

## 2019-09-15 LAB — URINALYSIS, ROUTINE W REFLEX MICROSCOPIC
Bilirubin Urine: NEGATIVE
Glucose, UA: NEGATIVE mg/dL
Hgb urine dipstick: NEGATIVE
Ketones, ur: 20 mg/dL — AB
Leukocytes,Ua: NEGATIVE
Nitrite: POSITIVE — AB
Protein, ur: NEGATIVE mg/dL
Specific Gravity, Urine: 1.016 (ref 1.005–1.030)
pH: 6 (ref 5.0–8.0)

## 2019-09-15 MED ORDER — CEPHALEXIN 500 MG PO CAPS: 500.0000 mg | ORAL_CAPSULE | Freq: Two times a day (BID) | ORAL | 0 refills | Status: AC

## 2019-09-15 MED ORDER — CEPHALEXIN 250 MG PO CAPS
500.0000 mg | ORAL_CAPSULE | Freq: Once | ORAL | Status: AC
  Administered 2019-09-15: 01:00:00 500 mg via ORAL
  Filled 2019-09-15: qty 2

## 2019-09-15 NOTE — ED Notes (Signed)
Pt picked up by son. Discharge instructions discussed with son and pt. Son verbalized understanding. Pt stable and ambulatory.

## 2019-09-15 NOTE — ED Notes (Signed)
Son, Dawn Buchanan called for update. Son is picking pt up.

## 2019-09-15 NOTE — ED Notes (Signed)
[facility] called for transport.  

## 2019-09-15 NOTE — ED Notes (Signed)
[facility] called to cancel transportation. Dawn Buchanan, cancelled.

## 2019-09-17 LAB — URINE CULTURE: Culture: >=100000 — AB

## 2019-09-18 ENCOUNTER — Telehealth: Payer: Self-pay | Admitting: *Deleted

## 2019-09-18 NOTE — Telephone Encounter (Signed)
Post ED Visit - Positive Culture Follow-up  Culture report reviewed by antimicrobial stewardship pharmacist: [place] Team []  Elenor Quinones, Pharm.D. []  Heide Guile, Pharm.D., BCPS AQ-ID []  Parks Neptune, Pharm.D., BCPS []  Alycia Rossetti, Pharm.D., BCPS []  [place], Pharm.D., BCPS, AAHIVP []  Legrand Como, Pharm.D., BCPS, AAHIVP [x]  Salome Arnt, PharmD, BCPS []  Johnnette Gourd, PharmD, BCPS []  Hughes Better, PharmD, BCPS []  Leeroy Cha, PharmD []  Laqueta Linden, PharmD, BCPS []  Albertina Parr, PharmD  [place] Team []  Leodis Sias, PharmD []  Lindell Spar, PharmD []  Royetta Asal, PharmD []  Graylin Shiver, Rph []  Rema Fendt) Glennon Mac, PharmD []  Arlyn Dunning, PharmD []  Netta Cedars, PharmD []  Dia Sitter, PharmD []  Leone Haven, PharmD []  Gretta Arab, PharmD []  Theodis Shove, PharmD []  Peggyann Juba, PharmD []  Reuel Boom, PharmD   Positive urine culture Treated with Cephalexin, organism sensitive to the same and no further patient follow-up is required at this time.  Harlon Flor Talley 09/18/2019, 10:00 AM

## 2019-10-14 DIAGNOSIS — Z111 Encounter for screening for respiratory tuberculosis: Secondary | ICD-10-CM | POA: Diagnosis not present

## 2019-10-21 DIAGNOSIS — L03116 Cellulitis of left lower limb: Secondary | ICD-10-CM | POA: Diagnosis not present

## 2019-10-21 DIAGNOSIS — K648 Other hemorrhoids: Secondary | ICD-10-CM | POA: Diagnosis not present

## 2019-11-02 DIAGNOSIS — K648 Other hemorrhoids: Secondary | ICD-10-CM | POA: Diagnosis not present

## 2019-11-02 DIAGNOSIS — W182XXD Fall in (into) shower or empty bathtub, subsequent encounter: Secondary | ICD-10-CM | POA: Diagnosis not present

## 2019-11-02 DIAGNOSIS — I872 Venous insufficiency (chronic) (peripheral): Secondary | ICD-10-CM | POA: Diagnosis not present

## 2019-11-02 DIAGNOSIS — K219 Gastro-esophageal reflux disease without esophagitis: Secondary | ICD-10-CM | POA: Diagnosis not present

## 2019-11-02 DIAGNOSIS — L03116 Cellulitis of left lower limb: Secondary | ICD-10-CM | POA: Diagnosis not present

## 2019-11-02 DIAGNOSIS — I080 Rheumatic disorders of both mitral and aortic valves: Secondary | ICD-10-CM | POA: Diagnosis not present

## 2019-11-02 DIAGNOSIS — E7800 Pure hypercholesterolemia, unspecified: Secondary | ICD-10-CM | POA: Diagnosis not present

## 2019-11-02 DIAGNOSIS — I672 Cerebral atherosclerosis: Secondary | ICD-10-CM | POA: Diagnosis not present

## 2019-11-02 DIAGNOSIS — Z9181 History of falling: Secondary | ICD-10-CM | POA: Diagnosis not present

## 2019-11-02 DIAGNOSIS — F0280 Dementia in other diseases classified elsewhere without behavioral disturbance: Secondary | ICD-10-CM | POA: Diagnosis not present

## 2019-11-02 DIAGNOSIS — S81802D Unspecified open wound, left lower leg, subsequent encounter: Secondary | ICD-10-CM | POA: Diagnosis not present

## 2019-11-02 DIAGNOSIS — G301 Alzheimer's disease with late onset: Secondary | ICD-10-CM | POA: Diagnosis not present

## 2019-11-02 DIAGNOSIS — I10 Essential (primary) hypertension: Secondary | ICD-10-CM | POA: Diagnosis not present

## 2019-11-04 DIAGNOSIS — L03116 Cellulitis of left lower limb: Secondary | ICD-10-CM | POA: Diagnosis not present

## 2019-11-04 DIAGNOSIS — W182XXD Fall in (into) shower or empty bathtub, subsequent encounter: Secondary | ICD-10-CM | POA: Diagnosis not present

## 2019-11-04 DIAGNOSIS — G301 Alzheimer's disease with late onset: Secondary | ICD-10-CM | POA: Diagnosis not present

## 2019-11-04 DIAGNOSIS — I872 Venous insufficiency (chronic) (peripheral): Secondary | ICD-10-CM | POA: Diagnosis not present

## 2019-11-04 DIAGNOSIS — S81802D Unspecified open wound, left lower leg, subsequent encounter: Secondary | ICD-10-CM | POA: Diagnosis not present

## 2019-11-04 DIAGNOSIS — F0280 Dementia in other diseases classified elsewhere without behavioral disturbance: Secondary | ICD-10-CM | POA: Diagnosis not present

## 2019-11-06 DIAGNOSIS — I872 Venous insufficiency (chronic) (peripheral): Secondary | ICD-10-CM | POA: Diagnosis not present

## 2019-11-06 DIAGNOSIS — S81802D Unspecified open wound, left lower leg, subsequent encounter: Secondary | ICD-10-CM | POA: Diagnosis not present

## 2019-11-06 DIAGNOSIS — L03116 Cellulitis of left lower limb: Secondary | ICD-10-CM | POA: Diagnosis not present

## 2019-11-06 DIAGNOSIS — G301 Alzheimer's disease with late onset: Secondary | ICD-10-CM | POA: Diagnosis not present

## 2019-11-06 DIAGNOSIS — F0280 Dementia in other diseases classified elsewhere without behavioral disturbance: Secondary | ICD-10-CM | POA: Diagnosis not present

## 2019-11-06 DIAGNOSIS — W182XXD Fall in (into) shower or empty bathtub, subsequent encounter: Secondary | ICD-10-CM | POA: Diagnosis not present

## 2019-11-10 DIAGNOSIS — W182XXD Fall in (into) shower or empty bathtub, subsequent encounter: Secondary | ICD-10-CM | POA: Diagnosis not present

## 2019-11-10 DIAGNOSIS — F0280 Dementia in other diseases classified elsewhere without behavioral disturbance: Secondary | ICD-10-CM | POA: Diagnosis not present

## 2019-11-10 DIAGNOSIS — S81802D Unspecified open wound, left lower leg, subsequent encounter: Secondary | ICD-10-CM | POA: Diagnosis not present

## 2019-11-10 DIAGNOSIS — G301 Alzheimer's disease with late onset: Secondary | ICD-10-CM | POA: Diagnosis not present

## 2019-11-10 DIAGNOSIS — L03116 Cellulitis of left lower limb: Secondary | ICD-10-CM | POA: Diagnosis not present

## 2019-11-10 DIAGNOSIS — I872 Venous insufficiency (chronic) (peripheral): Secondary | ICD-10-CM | POA: Diagnosis not present

## 2019-11-11 DIAGNOSIS — F0280 Dementia in other diseases classified elsewhere without behavioral disturbance: Secondary | ICD-10-CM | POA: Diagnosis not present

## 2019-11-11 DIAGNOSIS — S81802D Unspecified open wound, left lower leg, subsequent encounter: Secondary | ICD-10-CM | POA: Diagnosis not present

## 2019-11-11 DIAGNOSIS — G301 Alzheimer's disease with late onset: Secondary | ICD-10-CM | POA: Diagnosis not present

## 2019-11-11 DIAGNOSIS — W182XXD Fall in (into) shower or empty bathtub, subsequent encounter: Secondary | ICD-10-CM | POA: Diagnosis not present

## 2019-11-11 DIAGNOSIS — I872 Venous insufficiency (chronic) (peripheral): Secondary | ICD-10-CM | POA: Diagnosis not present

## 2019-11-11 DIAGNOSIS — L03116 Cellulitis of left lower limb: Secondary | ICD-10-CM | POA: Diagnosis not present

## 2019-11-12 DIAGNOSIS — S81802D Unspecified open wound, left lower leg, subsequent encounter: Secondary | ICD-10-CM | POA: Diagnosis not present

## 2019-11-12 DIAGNOSIS — I872 Venous insufficiency (chronic) (peripheral): Secondary | ICD-10-CM | POA: Diagnosis not present

## 2019-11-12 DIAGNOSIS — F0280 Dementia in other diseases classified elsewhere without behavioral disturbance: Secondary | ICD-10-CM | POA: Diagnosis not present

## 2019-11-12 DIAGNOSIS — G301 Alzheimer's disease with late onset: Secondary | ICD-10-CM | POA: Diagnosis not present

## 2019-11-12 DIAGNOSIS — W182XXD Fall in (into) shower or empty bathtub, subsequent encounter: Secondary | ICD-10-CM | POA: Diagnosis not present

## 2019-11-12 DIAGNOSIS — L03116 Cellulitis of left lower limb: Secondary | ICD-10-CM | POA: Diagnosis not present

## 2019-11-16 DIAGNOSIS — L03116 Cellulitis of left lower limb: Secondary | ICD-10-CM | POA: Diagnosis not present

## 2019-11-16 DIAGNOSIS — G301 Alzheimer's disease with late onset: Secondary | ICD-10-CM | POA: Diagnosis not present

## 2019-11-16 DIAGNOSIS — W182XXD Fall in (into) shower or empty bathtub, subsequent encounter: Secondary | ICD-10-CM | POA: Diagnosis not present

## 2019-11-16 DIAGNOSIS — I872 Venous insufficiency (chronic) (peripheral): Secondary | ICD-10-CM | POA: Diagnosis not present

## 2019-11-16 DIAGNOSIS — F0280 Dementia in other diseases classified elsewhere without behavioral disturbance: Secondary | ICD-10-CM | POA: Diagnosis not present

## 2019-11-16 DIAGNOSIS — S81802D Unspecified open wound, left lower leg, subsequent encounter: Secondary | ICD-10-CM | POA: Diagnosis not present

## 2019-11-17 DIAGNOSIS — W182XXD Fall in (into) shower or empty bathtub, subsequent encounter: Secondary | ICD-10-CM | POA: Diagnosis not present

## 2019-11-17 DIAGNOSIS — L03116 Cellulitis of left lower limb: Secondary | ICD-10-CM | POA: Diagnosis not present

## 2019-11-17 DIAGNOSIS — I872 Venous insufficiency (chronic) (peripheral): Secondary | ICD-10-CM | POA: Diagnosis not present

## 2019-11-17 DIAGNOSIS — G301 Alzheimer's disease with late onset: Secondary | ICD-10-CM | POA: Diagnosis not present

## 2019-11-17 DIAGNOSIS — F0280 Dementia in other diseases classified elsewhere without behavioral disturbance: Secondary | ICD-10-CM | POA: Diagnosis not present

## 2019-11-17 DIAGNOSIS — S81802D Unspecified open wound, left lower leg, subsequent encounter: Secondary | ICD-10-CM | POA: Diagnosis not present

## 2019-11-19 DIAGNOSIS — G301 Alzheimer's disease with late onset: Secondary | ICD-10-CM | POA: Diagnosis not present

## 2019-11-19 DIAGNOSIS — I872 Venous insufficiency (chronic) (peripheral): Secondary | ICD-10-CM | POA: Diagnosis not present

## 2019-11-19 DIAGNOSIS — S81802D Unspecified open wound, left lower leg, subsequent encounter: Secondary | ICD-10-CM | POA: Diagnosis not present

## 2019-11-19 DIAGNOSIS — W182XXD Fall in (into) shower or empty bathtub, subsequent encounter: Secondary | ICD-10-CM | POA: Diagnosis not present

## 2019-11-19 DIAGNOSIS — F0280 Dementia in other diseases classified elsewhere without behavioral disturbance: Secondary | ICD-10-CM | POA: Diagnosis not present

## 2019-11-19 DIAGNOSIS — L03116 Cellulitis of left lower limb: Secondary | ICD-10-CM | POA: Diagnosis not present

## 2019-11-24 DIAGNOSIS — S81802D Unspecified open wound, left lower leg, subsequent encounter: Secondary | ICD-10-CM | POA: Diagnosis not present

## 2019-11-24 DIAGNOSIS — G301 Alzheimer's disease with late onset: Secondary | ICD-10-CM | POA: Diagnosis not present

## 2019-11-24 DIAGNOSIS — F0280 Dementia in other diseases classified elsewhere without behavioral disturbance: Secondary | ICD-10-CM | POA: Diagnosis not present

## 2019-11-24 DIAGNOSIS — L03116 Cellulitis of left lower limb: Secondary | ICD-10-CM | POA: Diagnosis not present

## 2019-11-24 DIAGNOSIS — I872 Venous insufficiency (chronic) (peripheral): Secondary | ICD-10-CM | POA: Diagnosis not present

## 2019-11-24 DIAGNOSIS — W182XXD Fall in (into) shower or empty bathtub, subsequent encounter: Secondary | ICD-10-CM | POA: Diagnosis not present

## 2019-11-25 DIAGNOSIS — F0280 Dementia in other diseases classified elsewhere without behavioral disturbance: Secondary | ICD-10-CM | POA: Diagnosis not present

## 2019-11-25 DIAGNOSIS — W182XXD Fall in (into) shower or empty bathtub, subsequent encounter: Secondary | ICD-10-CM | POA: Diagnosis not present

## 2019-11-25 DIAGNOSIS — I872 Venous insufficiency (chronic) (peripheral): Secondary | ICD-10-CM | POA: Diagnosis not present

## 2019-11-25 DIAGNOSIS — G301 Alzheimer's disease with late onset: Secondary | ICD-10-CM | POA: Diagnosis not present

## 2019-11-25 DIAGNOSIS — L03116 Cellulitis of left lower limb: Secondary | ICD-10-CM | POA: Diagnosis not present

## 2019-11-25 DIAGNOSIS — S81802D Unspecified open wound, left lower leg, subsequent encounter: Secondary | ICD-10-CM | POA: Diagnosis not present

## 2019-11-26 DIAGNOSIS — F0280 Dementia in other diseases classified elsewhere without behavioral disturbance: Secondary | ICD-10-CM | POA: Diagnosis not present

## 2019-11-26 DIAGNOSIS — S81802D Unspecified open wound, left lower leg, subsequent encounter: Secondary | ICD-10-CM | POA: Diagnosis not present

## 2019-11-26 DIAGNOSIS — G301 Alzheimer's disease with late onset: Secondary | ICD-10-CM | POA: Diagnosis not present

## 2019-11-26 DIAGNOSIS — W182XXD Fall in (into) shower or empty bathtub, subsequent encounter: Secondary | ICD-10-CM | POA: Diagnosis not present

## 2019-11-26 DIAGNOSIS — L03116 Cellulitis of left lower limb: Secondary | ICD-10-CM | POA: Diagnosis not present

## 2019-11-26 DIAGNOSIS — I872 Venous insufficiency (chronic) (peripheral): Secondary | ICD-10-CM | POA: Diagnosis not present

## 2019-11-30 DIAGNOSIS — K648 Other hemorrhoids: Secondary | ICD-10-CM | POA: Diagnosis not present

## 2019-11-30 DIAGNOSIS — Z79899 Other long term (current) drug therapy: Secondary | ICD-10-CM | POA: Diagnosis not present

## 2019-11-30 DIAGNOSIS — I10 Essential (primary) hypertension: Secondary | ICD-10-CM | POA: Diagnosis not present

## 2019-11-30 DIAGNOSIS — F0280 Dementia in other diseases classified elsewhere without behavioral disturbance: Secondary | ICD-10-CM | POA: Diagnosis not present

## 2019-11-30 DIAGNOSIS — I872 Venous insufficiency (chronic) (peripheral): Secondary | ICD-10-CM | POA: Diagnosis not present

## 2019-11-30 DIAGNOSIS — W182XXD Fall in (into) shower or empty bathtub, subsequent encounter: Secondary | ICD-10-CM | POA: Diagnosis not present

## 2019-11-30 DIAGNOSIS — J301 Allergic rhinitis due to pollen: Secondary | ICD-10-CM | POA: Diagnosis not present

## 2019-11-30 DIAGNOSIS — L03116 Cellulitis of left lower limb: Secondary | ICD-10-CM | POA: Diagnosis not present

## 2019-11-30 DIAGNOSIS — G301 Alzheimer's disease with late onset: Secondary | ICD-10-CM | POA: Diagnosis not present

## 2019-11-30 DIAGNOSIS — S81802D Unspecified open wound, left lower leg, subsequent encounter: Secondary | ICD-10-CM | POA: Diagnosis not present

## 2019-12-02 DIAGNOSIS — K219 Gastro-esophageal reflux disease without esophagitis: Secondary | ICD-10-CM | POA: Diagnosis not present

## 2019-12-02 DIAGNOSIS — L03116 Cellulitis of left lower limb: Secondary | ICD-10-CM | POA: Diagnosis not present

## 2019-12-02 DIAGNOSIS — E7800 Pure hypercholesterolemia, unspecified: Secondary | ICD-10-CM | POA: Diagnosis not present

## 2019-12-02 DIAGNOSIS — K648 Other hemorrhoids: Secondary | ICD-10-CM | POA: Diagnosis not present

## 2019-12-02 DIAGNOSIS — G301 Alzheimer's disease with late onset: Secondary | ICD-10-CM | POA: Diagnosis not present

## 2019-12-02 DIAGNOSIS — I080 Rheumatic disorders of both mitral and aortic valves: Secondary | ICD-10-CM | POA: Diagnosis not present

## 2019-12-02 DIAGNOSIS — I10 Essential (primary) hypertension: Secondary | ICD-10-CM | POA: Diagnosis not present

## 2019-12-02 DIAGNOSIS — F0280 Dementia in other diseases classified elsewhere without behavioral disturbance: Secondary | ICD-10-CM | POA: Diagnosis not present

## 2019-12-02 DIAGNOSIS — I872 Venous insufficiency (chronic) (peripheral): Secondary | ICD-10-CM | POA: Diagnosis not present

## 2019-12-02 DIAGNOSIS — S81802D Unspecified open wound, left lower leg, subsequent encounter: Secondary | ICD-10-CM | POA: Diagnosis not present

## 2019-12-02 DIAGNOSIS — Z9181 History of falling: Secondary | ICD-10-CM | POA: Diagnosis not present

## 2019-12-02 DIAGNOSIS — W182XXD Fall in (into) shower or empty bathtub, subsequent encounter: Secondary | ICD-10-CM | POA: Diagnosis not present

## 2019-12-03 DIAGNOSIS — I872 Venous insufficiency (chronic) (peripheral): Secondary | ICD-10-CM | POA: Diagnosis not present

## 2019-12-03 DIAGNOSIS — S81802D Unspecified open wound, left lower leg, subsequent encounter: Secondary | ICD-10-CM | POA: Diagnosis not present

## 2019-12-03 DIAGNOSIS — W182XXD Fall in (into) shower or empty bathtub, subsequent encounter: Secondary | ICD-10-CM | POA: Diagnosis not present

## 2019-12-03 DIAGNOSIS — L03116 Cellulitis of left lower limb: Secondary | ICD-10-CM | POA: Diagnosis not present

## 2019-12-03 DIAGNOSIS — F0280 Dementia in other diseases classified elsewhere without behavioral disturbance: Secondary | ICD-10-CM | POA: Diagnosis not present

## 2019-12-03 DIAGNOSIS — G301 Alzheimer's disease with late onset: Secondary | ICD-10-CM | POA: Diagnosis not present

## 2019-12-08 DIAGNOSIS — Z23 Encounter for immunization: Secondary | ICD-10-CM | POA: Diagnosis not present

## 2019-12-09 DIAGNOSIS — I872 Venous insufficiency (chronic) (peripheral): Secondary | ICD-10-CM | POA: Diagnosis not present

## 2019-12-09 DIAGNOSIS — G301 Alzheimer's disease with late onset: Secondary | ICD-10-CM | POA: Diagnosis not present

## 2019-12-09 DIAGNOSIS — S81802D Unspecified open wound, left lower leg, subsequent encounter: Secondary | ICD-10-CM | POA: Diagnosis not present

## 2019-12-09 DIAGNOSIS — L03116 Cellulitis of left lower limb: Secondary | ICD-10-CM | POA: Diagnosis not present

## 2019-12-09 DIAGNOSIS — W182XXD Fall in (into) shower or empty bathtub, subsequent encounter: Secondary | ICD-10-CM | POA: Diagnosis not present

## 2019-12-09 DIAGNOSIS — F0280 Dementia in other diseases classified elsewhere without behavioral disturbance: Secondary | ICD-10-CM | POA: Diagnosis not present

## 2019-12-14 DIAGNOSIS — I872 Venous insufficiency (chronic) (peripheral): Secondary | ICD-10-CM | POA: Diagnosis not present

## 2019-12-14 DIAGNOSIS — G301 Alzheimer's disease with late onset: Secondary | ICD-10-CM | POA: Diagnosis not present

## 2019-12-14 DIAGNOSIS — L03116 Cellulitis of left lower limb: Secondary | ICD-10-CM | POA: Diagnosis not present

## 2019-12-14 DIAGNOSIS — W182XXD Fall in (into) shower or empty bathtub, subsequent encounter: Secondary | ICD-10-CM | POA: Diagnosis not present

## 2019-12-14 DIAGNOSIS — F0280 Dementia in other diseases classified elsewhere without behavioral disturbance: Secondary | ICD-10-CM | POA: Diagnosis not present

## 2019-12-14 DIAGNOSIS — S81802D Unspecified open wound, left lower leg, subsequent encounter: Secondary | ICD-10-CM | POA: Diagnosis not present

## 2019-12-21 DIAGNOSIS — L03116 Cellulitis of left lower limb: Secondary | ICD-10-CM | POA: Diagnosis not present

## 2019-12-21 DIAGNOSIS — F0280 Dementia in other diseases classified elsewhere without behavioral disturbance: Secondary | ICD-10-CM | POA: Diagnosis not present

## 2019-12-21 DIAGNOSIS — W182XXD Fall in (into) shower or empty bathtub, subsequent encounter: Secondary | ICD-10-CM | POA: Diagnosis not present

## 2019-12-21 DIAGNOSIS — I872 Venous insufficiency (chronic) (peripheral): Secondary | ICD-10-CM | POA: Diagnosis not present

## 2019-12-21 DIAGNOSIS — G301 Alzheimer's disease with late onset: Secondary | ICD-10-CM | POA: Diagnosis not present

## 2019-12-21 DIAGNOSIS — S81802D Unspecified open wound, left lower leg, subsequent encounter: Secondary | ICD-10-CM | POA: Diagnosis not present

## 2019-12-28 DIAGNOSIS — H903 Sensorineural hearing loss, bilateral: Secondary | ICD-10-CM | POA: Diagnosis not present

## 2019-12-28 DIAGNOSIS — S81802D Unspecified open wound, left lower leg, subsequent encounter: Secondary | ICD-10-CM | POA: Diagnosis not present

## 2019-12-28 DIAGNOSIS — G301 Alzheimer's disease with late onset: Secondary | ICD-10-CM | POA: Diagnosis not present

## 2019-12-28 DIAGNOSIS — W182XXD Fall in (into) shower or empty bathtub, subsequent encounter: Secondary | ICD-10-CM | POA: Diagnosis not present

## 2019-12-28 DIAGNOSIS — I872 Venous insufficiency (chronic) (peripheral): Secondary | ICD-10-CM | POA: Diagnosis not present

## 2019-12-28 DIAGNOSIS — I10 Essential (primary) hypertension: Secondary | ICD-10-CM | POA: Diagnosis not present

## 2019-12-28 DIAGNOSIS — F0280 Dementia in other diseases classified elsewhere without behavioral disturbance: Secondary | ICD-10-CM | POA: Diagnosis not present

## 2019-12-28 DIAGNOSIS — L03116 Cellulitis of left lower limb: Secondary | ICD-10-CM | POA: Diagnosis not present

## 2020-01-12 ENCOUNTER — Encounter (HOSPITAL_COMMUNITY): Payer: Self-pay | Admitting: Emergency Medicine

## 2020-01-12 ENCOUNTER — Inpatient Hospital Stay (HOSPITAL_COMMUNITY)
Admission: EM | Admit: 2020-01-12 | Discharge: 2020-01-14 | DRG: 391 | Disposition: A | Discharge status: Nursing Home (Includes ICF) | Payer: Medicare Other | Source: Skilled Nursing Facility | Attending: Student | Admitting: Student

## 2020-01-12 ENCOUNTER — Emergency Department (HOSPITAL_COMMUNITY): Payer: Medicare Other

## 2020-01-12 DIAGNOSIS — Z90710 Acquired absence of both cervix and uterus: Secondary | ICD-10-CM

## 2020-01-12 DIAGNOSIS — I480 Paroxysmal atrial fibrillation: Secondary | ICD-10-CM | POA: Diagnosis present

## 2020-01-12 DIAGNOSIS — R000 Tachycardia, unspecified: Secondary | ICD-10-CM | POA: Diagnosis not present

## 2020-01-12 DIAGNOSIS — Z8616 Personal history of COVID-19: Secondary | ICD-10-CM

## 2020-01-12 DIAGNOSIS — K5792 Diverticulitis of intestine, part unspecified, without perforation or abscess without bleeding: Secondary | ICD-10-CM | POA: Diagnosis present

## 2020-01-12 DIAGNOSIS — G309 Alzheimer's disease, unspecified: Secondary | ICD-10-CM | POA: Diagnosis present

## 2020-01-12 DIAGNOSIS — Z79899 Other long term (current) drug therapy: Secondary | ICD-10-CM

## 2020-01-12 DIAGNOSIS — F0280 Dementia in other diseases classified elsewhere without behavioral disturbance: Secondary | ICD-10-CM | POA: Diagnosis present

## 2020-01-12 DIAGNOSIS — I5032 Chronic diastolic (congestive) heart failure: Secondary | ICD-10-CM | POA: Diagnosis present

## 2020-01-12 DIAGNOSIS — F0390 Unspecified dementia without behavioral disturbance: Secondary | ICD-10-CM | POA: Diagnosis not present

## 2020-01-12 DIAGNOSIS — I340 Nonrheumatic mitral (valve) insufficiency: Secondary | ICD-10-CM | POA: Diagnosis not present

## 2020-01-12 DIAGNOSIS — I4891 Unspecified atrial fibrillation: Secondary | ICD-10-CM

## 2020-01-12 DIAGNOSIS — Z66 Do not resuscitate: Secondary | ICD-10-CM | POA: Diagnosis present

## 2020-01-12 DIAGNOSIS — Z789 Other specified health status: Secondary | ICD-10-CM | POA: Diagnosis not present

## 2020-01-12 DIAGNOSIS — J811 Chronic pulmonary edema: Secondary | ICD-10-CM | POA: Diagnosis not present

## 2020-01-12 DIAGNOSIS — I4892 Unspecified atrial flutter: Secondary | ICD-10-CM | POA: Diagnosis not present

## 2020-01-12 DIAGNOSIS — R42 Dizziness and giddiness: Secondary | ICD-10-CM | POA: Diagnosis not present

## 2020-01-12 DIAGNOSIS — R52 Pain, unspecified: Secondary | ICD-10-CM | POA: Diagnosis not present

## 2020-01-12 DIAGNOSIS — N281 Cyst of kidney, acquired: Secondary | ICD-10-CM | POA: Diagnosis not present

## 2020-01-12 DIAGNOSIS — Z7189 Other specified counseling: Secondary | ICD-10-CM | POA: Diagnosis not present

## 2020-01-12 DIAGNOSIS — Z9181 History of falling: Secondary | ICD-10-CM

## 2020-01-12 DIAGNOSIS — I110 Hypertensive heart disease with heart failure: Secondary | ICD-10-CM | POA: Diagnosis present

## 2020-01-12 DIAGNOSIS — R1084 Generalized abdominal pain: Secondary | ICD-10-CM | POA: Diagnosis not present

## 2020-01-12 DIAGNOSIS — I361 Nonrheumatic tricuspid (valve) insufficiency: Secondary | ICD-10-CM | POA: Diagnosis not present

## 2020-01-12 DIAGNOSIS — R5381 Other malaise: Secondary | ICD-10-CM | POA: Diagnosis not present

## 2020-01-12 DIAGNOSIS — I5033 Acute on chronic diastolic (congestive) heart failure: Secondary | ICD-10-CM | POA: Diagnosis present

## 2020-01-12 DIAGNOSIS — I351 Nonrheumatic aortic (valve) insufficiency: Secondary | ICD-10-CM | POA: Diagnosis not present

## 2020-01-12 DIAGNOSIS — K5732 Diverticulitis of large intestine without perforation or abscess without bleeding: Secondary | ICD-10-CM | POA: Diagnosis not present

## 2020-01-12 LAB — CBC WITH DIFFERENTIAL/PLATELET
Abs Immature Granulocytes: 0.03 10*3/uL (ref 0.00–0.07)
Basophils Absolute: 0.1 10*3/uL (ref 0.0–0.1)
Basophils Relative: 1 %
Eosinophils Absolute: 0.2 10*3/uL (ref 0.0–0.5)
Eosinophils Relative: 2 %
HCT: 44.2 % (ref 36.0–46.0)
Hemoglobin: 14.1 g/dL (ref 12.0–15.0)
Immature Granulocytes: 0 %
Lymphocytes Relative: 17 %
Lymphs Abs: 1.6 10*3/uL (ref 0.7–4.0)
MCH: 28.8 pg (ref 26.0–34.0)
MCHC: 31.9 g/dL (ref 30.0–36.0)
MCV: 90.4 fL (ref 80.0–100.0)
Monocytes Absolute: 0.7 10*3/uL (ref 0.1–1.0)
Monocytes Relative: 7 %
Neutro Abs: 7.2 10*3/uL (ref 1.7–7.7)
Neutrophils Relative %: 73 %
Platelets: 292 10*3/uL (ref 150–400)
RBC: 4.89 MIL/uL (ref 3.87–5.11)
RDW: 13.2 % (ref 11.5–15.5)
WBC: 9.7 10*3/uL (ref 4.0–10.5)
nRBC: 0 % (ref 0.0–0.2)

## 2020-01-12 LAB — URINALYSIS, ROUTINE W REFLEX MICROSCOPIC
Bilirubin Urine: NEGATIVE
Glucose, UA: NEGATIVE mg/dL
Hgb urine dipstick: NEGATIVE
Ketones, ur: NEGATIVE mg/dL
Leukocytes,Ua: NEGATIVE
Nitrite: NEGATIVE
Protein, ur: NEGATIVE mg/dL
Specific Gravity, Urine: 1.028 (ref 1.005–1.030)
pH: 6 (ref 5.0–8.0)

## 2020-01-12 LAB — COMPREHENSIVE METABOLIC PANEL
ALT: 13 U/L (ref 0–44)
AST: 15 U/L (ref 15–41)
Albumin: 3.2 g/dL — ABNORMAL LOW (ref 3.5–5.0)
Alkaline Phosphatase: 64 U/L (ref 38–126)
Anion gap: 11 (ref 5–15)
BUN: 8 mg/dL (ref 8–23)
CO2: 24 mmol/L (ref 22–32)
Calcium: 8.9 mg/dL (ref 8.9–10.3)
Chloride: 104 mmol/L (ref 98–111)
Creatinine, Ser: 0.75 mg/dL (ref 0.44–1.00)
GFR calc Af Amer: >60 mL/min (ref >60)
GFR calc non Af Amer: >60 mL/min (ref >60)
Glucose, Bld: 103 mg/dL — ABNORMAL HIGH (ref 70–99)
Potassium: 3.5 mmol/L (ref 3.5–5.1)
Sodium: 139 mmol/L (ref 135–145)
Total Bilirubin: 1.2 mg/dL (ref 0.3–1.2)
Total Protein: 6.9 g/dL (ref 6.5–8.1)

## 2020-01-12 LAB — TROPONIN I (HIGH SENSITIVITY)
Troponin I (High Sensitivity): 10 ng/L (ref <18)
Troponin I (High Sensitivity): 10 ng/L (ref <18)

## 2020-01-12 LAB — LIPASE, BLOOD: Lipase: 29 U/L (ref 11–51)

## 2020-01-12 LAB — SARS CORONAVIRUS 2 BY RT PCR (HOSPITAL ORDER, PERFORMED IN ~~LOC~~ HOSPITAL LAB): SARS Coronavirus 2: NEGATIVE

## 2020-01-12 LAB — MAGNESIUM: Magnesium: 1.9 mg/dL (ref 1.7–2.4)

## 2020-01-12 LAB — TSH: TSH: 2.065 u[IU]/mL (ref 0.350–4.500)

## 2020-01-12 LAB — BRAIN NATRIURETIC PEPTIDE: B Natriuretic Peptide: 551.2 pg/mL — ABNORMAL HIGH (ref 0.0–100.0)

## 2020-01-12 MED ORDER — SODIUM CHLORIDE 0.9% FLUSH
3.0000 mL | Freq: Two times a day (BID) | INTRAVENOUS | Status: DC
  Administered 2020-01-12 – 2020-01-14 (×3): 3 mL via INTRAVENOUS

## 2020-01-12 MED ORDER — DONEPEZIL HCL 5 MG PO TABS
5.0000 mg | ORAL_TABLET | Freq: Every day | ORAL | Status: DC
  Administered 2020-01-12 – 2020-01-13 (×2): 5 mg via ORAL
  Filled 2020-01-12 (×3): qty 1

## 2020-01-12 MED ORDER — ONDANSETRON HCL 4 MG PO TABS: 4.0000 mg | ORAL_TABLET | Freq: Four times a day (QID) | ORAL | Status: DC | PRN

## 2020-01-12 MED ORDER — LABETALOL HCL 5 MG/ML IV SOLN
10.0000 mg | INTRAVENOUS | Status: DC | PRN
  Administered 2020-01-12: 10 mg via INTRAVENOUS
  Filled 2020-01-12: qty 4

## 2020-01-12 MED ORDER — HALOPERIDOL LACTATE 5 MG/ML IJ SOLN
2.0000 mg | Freq: Four times a day (QID) | INTRAMUSCULAR | Status: AC | PRN
  Administered 2020-01-12 – 2020-01-13 (×2): 2 mg via INTRAVENOUS
  Filled 2020-01-12 (×2): qty 1

## 2020-01-12 MED ORDER — ENOXAPARIN SODIUM 40 MG/0.4ML ~~LOC~~ SOLN
40.0000 mg | SUBCUTANEOUS | Status: DC
  Administered 2020-01-12 – 2020-01-13 (×2): 40 mg via SUBCUTANEOUS
  Filled 2020-01-12 (×2): qty 0.4

## 2020-01-12 MED ORDER — MEMANTINE HCL ER 28 MG PO CP24
28.0000 mg | ORAL_CAPSULE | Freq: Every day | ORAL | Status: DC
  Administered 2020-01-13 – 2020-01-14 (×2): 28 mg via ORAL
  Filled 2020-01-12 (×2): qty 1

## 2020-01-12 MED ORDER — ACETAMINOPHEN 325 MG PO TABS: 650.0000 mg | ORAL_TABLET | Freq: Four times a day (QID) | ORAL | Status: DC | PRN

## 2020-01-12 MED ORDER — IOHEXOL 350 MG/ML SOLN
100.0000 mL | Freq: Once | INTRAVENOUS | Status: AC | PRN
  Administered 2020-01-12: 100 mL via INTRAVENOUS

## 2020-01-12 MED ORDER — DILTIAZEM LOAD VIA INFUSION
15.0000 mg | Freq: Once | INTRAVENOUS | Status: AC
  Administered 2020-01-12: 15 mg via INTRAVENOUS
  Filled 2020-01-12: qty 15

## 2020-01-12 MED ORDER — POTASSIUM CHLORIDE CRYS ER 20 MEQ PO TBCR
40.0000 meq | EXTENDED_RELEASE_TABLET | ORAL | Status: AC
  Administered 2020-01-12: 40 meq via ORAL
  Filled 2020-01-12: qty 2

## 2020-01-12 MED ORDER — ACETAMINOPHEN 650 MG RE SUPP: 650.0000 mg | Freq: Four times a day (QID) | RECTAL | Status: DC | PRN

## 2020-01-12 MED ORDER — PIPERACILLIN-TAZOBACTAM 3.375 G IVPB 30 MIN
3.3750 g | Freq: Once | INTRAVENOUS | Status: AC
  Administered 2020-01-12: 3.375 g via INTRAVENOUS
  Filled 2020-01-12: qty 50

## 2020-01-12 MED ORDER — ONDANSETRON HCL 4 MG/2ML IJ SOLN: 4.0000 mg | Freq: Four times a day (QID) | INTRAMUSCULAR | Status: DC | PRN

## 2020-01-12 MED ORDER — DILTIAZEM HCL-DEXTROSE 125-5 MG/125ML-% IV SOLN (PREMIX)
5.0000 mg/h | INTRAVENOUS | Status: DC
  Administered 2020-01-12: 5 mg/h via INTRAVENOUS
  Filled 2020-01-12: qty 125

## 2020-01-12 MED ORDER — ALBUTEROL SULFATE (2.5 MG/3ML) 0.083% IN NEBU: 2.5000 mg | INHALATION_SOLUTION | Freq: Four times a day (QID) | RESPIRATORY_TRACT | Status: DC | PRN

## 2020-01-12 NOTE — ED Provider Notes (Signed)
  Face-to-face evaluation   History: She presents for evaluation of abdominal pain, dizziness and was found to be tachycardic on arrival.  Physical exam: Elderly female, alert, calm.  Nontoxic appearance.  Heart borderline tachycardic, regular, lungs clear anteriorly.  Evaluation by me at 2:20 PM.  Abdomen is soft and nontender at this time.  Medical screening examination/treatment/procedure(s) were conducted as a shared visit with non-physician practitioner(s) and myself.  I personally evaluated the patient during the encounter    Daleen Bo, MD 01/12/20 [phone]

## 2020-01-12 NOTE — ED Notes (Signed)
Dinner ordered 

## 2020-01-12 NOTE — Progress Notes (Signed)
Patient arrived to 4e room 14. CHG bath completed, gown changed. Tele applied and CCMD notified. Patient is a-fib with HR 111 on arrival. BP 14/104 (126) at 2130 and 133/106 (115). MD paged. Patient oriented to room and how to use call bell. Will continue to monitor.  Adella Hare, RN

## 2020-01-12 NOTE — Progress Notes (Signed)
MD paged about Diastolic BP above [id number]. BP 133/106 (115).  Dr. Sharlet Salina ordered PRN labetalol.  Also spoke with Dr. Sharlet Salina about titration of Cardizem. Previous nurse stated she was told not to titrate the Cardizem. Dr. Sharlet Salina said that we can titrate the Cardizem if necessary, following the parameters.  Adella Hare, RN

## 2020-01-12 NOTE — H&P (Addendum)
History and Physical    Dawn Buchanan [id number] DOB: 12/18/1927 DOA: 01/12/2020  Referring MD/NP/PA: Rodell Perna, PA-C PCP: Lajean Manes, MD  Patient coming from: Memory care unit [place] via EMS  Chief Complaint: Abdominal pain dizziness  I have personally briefly reviewed patient's old medical records in [place]   HPI: Dawn Buchanan is a 86 y.o. female with medical history significant of paroxysmal atrial fibrillation not on anticoagulation, hypertension,  dementia, and history of COVID-19 in 08/2019 presents with complaints of abdominal pain and dizziness.  History is obtained from patient daughter-in-law who is present at bedside due to patient's dementia.  Normally patient is only oriented to herself at baseline.  Her family had taken her out for the weekend and patient was noted to have complained of abdominal pain once, but did not note further complaints thereafter.  At the facility she was noted again to complain of some left-sided abdominal pain and when standing reported feeling dizzy.  Patient currently denies any complaints.  Review of records note patient was seen in January when she tested positive for COVID-19 with diarrhea, UTI, and was found to be in A. fib with RVR.  She was thought to be a poor candidate for anticoagulation due to her history of dementia and increased falls risk.  Symptoms were thought related to her being acutely ill at the time.  She was supposed to be followed up in A. fib clinic, but is not clear if this was done.  ED Course: Upon admission into the emergency department patient was noted to be in atrial fibrillation with heart rates 106-156, respirations 16-23, and O2 saturations maintained on room air.  Labs were relatively unremarkable.  Chest x-ray noted cardiomegaly with mild interstitial edema.  CT scan of the abdomen and pelvis significant for diverticulitis without signs of abscess or perforation.  Patient was given Zosyn, 15 mg of  diltiazem IV, and then started on diltiazem drip.  [facility] called to admit.  Review of Systems  Unable to perform ROS: Dementia  HENT: Positive for hearing loss.   Gastrointestinal: Positive for abdominal pain.  Psychiatric/Behavioral: Positive for memory loss.    Past Medical History:  Diagnosis Date  . Dementia ([place])   . Hypertension     History reviewed. No pertinent surgical history.   reports that she has never smoked. She has never used smokeless tobacco. No history on file for alcohol and drug.  No Known Allergies  No family history on file.  Prior to Admission medications   Medication Sig Start Date End Date Taking? Authorizing Provider  donepezil (ARICEPT) 5 MG tablet Take 5 mg by mouth at bedtime.    Yes [provider]  memantine (NAMENDA XR) 28 MG CP24 24 hr capsule Take 1 capsule by mouth daily.   Yes [provider]  Mineral Oil OIL Place 2 drops into both ears once a week.   Yes [provider]    Physical Exam:  Constitutional: Elderly female currently in no acute distress Vitals:   01/12/20 1233 01/12/20 1237 01/12/20 1245 01/12/20 1315  BP: (!) 141/96 (!) 141/96 130/88 (!) 151/95  Pulse: (!) 114 (!) 142 (!) 137 (!) 106  Resp: (!) 23 16 18 19   Temp:  97.9 F (36.6 C)    TempSrc:  Oral    SpO2: 99% 95% 98% 98%   Eyes: PERRL, lids and conjunctivae normal ENMT: Mucous membranes are moist. Posterior pharynx clear of any exudate or lesions.hearing aid present. Neck: normal,  supple, no masses, no thyromegaly Respiratory: clear to auscultation bilaterally, no wheezing, no crackles. Normal respiratory effort. No accessory muscle use.  Cardiovascular: Irregular irregular, no murmurs / rubs / gallops. No extremity edema. 2+ pedal pulses. No carotid bruits.  Abdomen: Mild tenderness to palpation of the lower left quadrant, no masses palpated. No hepatosplenomegaly. Bowel sounds positive.  Musculoskeletal: no clubbing / cyanosis. No joint  deformity upper and lower extremities. Good ROM, no contractures. Normal muscle tone.  Skin: no rashes, lesions, ulcers. No induration Neurologic: CN 2-12 grossly intact. Sensation intact, DTR normal. Strength 5/5 in all 4.  Psychiatric: Poor memory. Alert and oriented x self. Normal mood.     Labs on Admission: I have personally reviewed following labs and imaging studies  CBC: Recent Labs  Lab 01/12/20 1300  WBC 9.7  NEUTROABS 7.2  HGB 14.1  HCT 44.2  MCV 90.4  PLT 123456   Basic Metabolic Panel: Recent Labs  Lab 01/12/20 1300  NA 139  K 3.5  CL 104  CO2 24  GLUCOSE 103*  BUN 8  CREATININE 0.75  CALCIUM 8.9   GFR: CrCl cannot be calculated (Unknown ideal weight.). Liver Function Tests: Recent Labs  Lab 01/12/20 1300  AST 15  ALT 13  ALKPHOS 64  BILITOT 1.2  PROT 6.9  ALBUMIN 3.2*   Recent Labs  Lab 01/12/20 1300  LIPASE 29   No results for input(s): AMMONIA in the last 168 hours. Coagulation Profile: No results for input(s): INR, PROTIME in the last 168 hours. Cardiac Enzymes: No results for input(s): CKTOTAL, CKMB, CKMBINDEX, TROPONINI in the last 168 hours. BNP (last 3 results) No results for input(s): PROBNP in the last 8760 hours. HbA1C: No results for input(s): HGBA1C in the last 72 hours. CBG: No results for input(s): GLUCAP in the last 168 hours. Lipid Profile: No results for input(s): CHOL, HDL, LDLCALC, TRIG, CHOLHDL, LDLDIRECT in the last 72 hours. Thyroid Function Tests: No results for input(s): TSH, T4TOTAL, FREET4, T3FREE, THYROIDAB in the last 72 hours. Anemia Panel: No results for input(s): VITAMINB12, FOLATE, FERRITIN, TIBC, IRON, RETICCTPCT in the last 72 hours. Urine analysis:    Component Value Date/Time   COLORURINE YELLOW 01/12/2020 1500   APPEARANCEUR CLEAR 01/12/2020 1500   LABSPEC 1.028 01/12/2020 1500   PHURINE 6.0 01/12/2020 1500   GLUCOSEU NEGATIVE 01/12/2020 1500   HGBUR NEGATIVE 01/12/2020 1500   BILIRUBINUR  NEGATIVE 01/12/2020 1500   KETONESUR NEGATIVE 01/12/2020 1500   PROTEINUR NEGATIVE 01/12/2020 1500   NITRITE NEGATIVE 01/12/2020 1500   LEUKOCYTESUR NEGATIVE 01/12/2020 1500   Sepsis Labs: No results found for this or any previous visit (from the past 240 hour(s)).   Radiological Exams on Admission: CT ABDOMEN PELVIS W CONTRAST  Result Date: 01/12/2020 CLINICAL DATA:  Left-sided abdominal pain EXAM: CT ABDOMEN AND PELVIS WITH CONTRAST TECHNIQUE: Multidetector CT imaging of the abdomen and pelvis was performed using the standard protocol following bolus administration of intravenous contrast. CONTRAST:  137mL OMNIPAQUE IOHEXOL 350 MG/ML SOLN COMPARISON:  None. FINDINGS: Lower chest: Mild dependent atelectatic changes are noted. Hepatobiliary: Fatty infiltration of the liver is noted. Cyst is noted within the caudate lobe measuring 2 cm. The gallbladder has been surgically removed. Pancreas: Unremarkable. No pancreatic ductal dilatation or surrounding inflammatory changes. Spleen: Normal in size without focal abnormality. Adrenals/Urinary Tract: Adrenal glands are within normal limits. Kidneys demonstrate a normal enhancement pattern. Small cysts are noted on the left. No renal calculi or obstructive changes are seen. The bladder is  partially distended. Stomach/Bowel: Diverticular change of the colon is noted with significant smooth muscle hypertrophy. Areas of inflammatory change are noted in the distal descending/proximal sigmoid colon consistent with diverticulitis. No definitive abscess or perforation is noted. Stomach is within normal limits. The small bowel is well visualized and within normal limits. Vascular/Lymphatic: Aortic atherosclerosis. No enlarged abdominal or pelvic lymph nodes. Reproductive: Status post hysterectomy. No adnexal masses. Other: Small fat containing umbilical hernia is noted. Musculoskeletal: Degenerative changes of lumbar spine are noted. IMPRESSION: Changes consistent with  diverticulitis without abscess or perforation. Hepatic and renal cysts. Fatty liver. Electronically Signed   By: Inez Catalina M.D.   On: 01/12/2020 15:10   DG Chest Portable 1 View  Result Date: 01/12/2020 CLINICAL DATA:  AFib EXAM: PORTABLE CHEST 1 VIEW COMPARISON:  September 14, 2019 FINDINGS: There is unchanged cardiomegaly. Aortic knob calcifications. Mildly increased interstitial markings seen throughout both lungs. Overall shallow degree of aeration with crowding of the bronchovascular structures. No acute osseous abnormality. IMPRESSION: cardiomegaly and mild interstitial edema. Electronically Signed   By: Prudencio Pair M.D.   On: 01/12/2020 13:28    EKG: Independently reviewed.  Atrial fibrillation with RVR at 156 bpm  Assessment/Plan Diverticulitis: Acute.  Patient presented from memory care unit at [facility] with abdominal pain.  CT scan of the abdomen and pelvis significant for diverticulitis without abscess or perforation for -Admit to a progressive bed -Advance diet as tolerated -Continue empiric antibiotics of Zosyn   Paroxysmal atrial fibrillation: Patient presented in atrial fibrillation with heart rates into the 150s.  She is not on any rate controlling medications, but records note that she was previously in atrial fibrillation in January of this year.  Patient at that time was noted to be a poor candidate for anticoagulation due to falls risks and was recommended to follow-up in A. fib clinic.  Last echocardiogram noted EF of 60 to 65% with grade 2 diastolic dysfunction in [id number].  -Continue Cardizem drip -Replacing electrolytes to goal potassium 4 and magnesium 2 -Check echocardiogram -Message sent for cardiology to eval in a.m.  Diastolic congestive heart failure, pulmonary edema: Acute.  Chest x-ray noting some mild pulmonary edema cardiomegaly.  On physical exam however patient's chest sounds clear and maintaining O2 saturation on room air.  Suspect secondary to rapid A.  fib. -Strict intake and output -Daily weights -Check BNP  -Follow-up echocardiogram -Consider need of IV diuresis  Dementia -Continue Aricept and Namenda  DVT prophylaxis: Lovenox Code Status: Full Family Communication: Daughter-in-law updated at bedside Disposition Plan: Likely discharge back to memory care unit once medically stable Consults called: Message sent Admission status: Infection  Norval Morton MD [facility] Pager [phone]   If 7PM-7AM, please contact night-coverage [email or web address] Password [email or web address]  01/12/2020, 4:11 PM

## 2020-01-12 NOTE — ED Provider Notes (Signed)
[facility] Provider Note   CSN: [id number] Arrival date & time: 01/12/20  1217     History Chief Complaint  Patient presents with  . Dizziness   Level 5 caveat due to dementia Dawn Buchanan is a 86 y.o. female with history of dementia, hypertension presents from [facility] facility for evaluation of left-sided abdominal pains.  Per EMS the patient had also told staff that when she got up she felt dizzy.  She is alert and oriented to person only which is baseline for her.  On my assessment the patient denies any complaints.  She is not sure why she is here.  She denies chest pain, abdominal pain, nausea, vomiting, or weakness.  She does yell out in pain on palpation of the left side of the abdomen primarily the left lower quadrant.  She is noted to have heart rates up to the 150s, irregular rhythm.  Per chart review she was seen in January 2021 in the ED for new onset A. fib with RVR with improvement in heart rate after Cardizem and fluid bolus.  She was found to be a poor candidate for anticoagulation due to her dementia and increased fall risk.  At that time she also had a UTI that was treated.  The recommendation was made to follow-up in the A. fib clinic outpatient but I do not see that she has done this.  The history is provided by medical records and the EMS personnel. The history is limited by the condition of the patient.       Past Medical History:  Diagnosis Date  . Dementia ([place])   . Hypertension     Patient Active Problem List   Diagnosis Date Noted  . Diverticulitis 01/12/2020  . AF (paroxysmal atrial fibrillation) ([place]) 01/12/2020    History reviewed. No pertinent surgical history.   OB History   No obstetric history on file.     No family history on file.  Social History   Tobacco Use  . Smoking status: Never Smoker  . Smokeless tobacco: Never Used  Substance Use Topics  . Alcohol use: Not on file  .  Drug use: Not on file    Home Medications Prior to Admission medications   Medication Sig Start Date End Date Taking? Authorizing Provider  donepezil (ARICEPT) 5 MG tablet Take 5 mg by mouth at bedtime.    Yes [provider]  memantine (NAMENDA XR) 28 MG CP24 24 hr capsule Take 1 capsule by mouth daily.   Yes [provider]  Mineral Oil OIL Place 2 drops into both ears once a week.   Yes [provider]    Allergies    Patient has no known allergies.  Review of Systems   Review of Systems  Unable to perform ROS: Dementia    Physical Exam Updated Vital Signs BP 139/90   Pulse (!) 55   Temp 97.9 F (36.6 C) (Oral)   Resp (!) 22   SpO2 100%   Physical Exam Vitals and nursing note reviewed.  Constitutional:      General: She is not in acute distress.    Appearance: She is well-developed.  HENT:     Head: Normocephalic and atraumatic.  Eyes:     General:        Right eye: No discharge.        Left eye: No discharge.     Conjunctiva/sclera: Conjunctivae normal.  Neck:  Vascular: No JVD.     Trachea: No tracheal deviation.  Cardiovascular:     Rate and Rhythm: Tachycardia present. Rhythm irregular.     Pulses: Normal pulses.  Pulmonary:     Effort: Pulmonary effort is normal.     Breath sounds: Normal breath sounds.  Abdominal:     General: Abdomen is protuberant. Bowel sounds are decreased. There is no distension.     Palpations: Abdomen is soft.     Tenderness: There is abdominal tenderness in the left upper quadrant and left lower quadrant. There is no right CVA tenderness, left CVA tenderness, guarding or rebound.  Skin:    Findings: No erythema.  Neurological:     Mental Status: She is alert.     Comments: Oriented to person only.  No facial droop.  Speech is fluent with no evidence of dysarthria or aphasia.  Moves all extremities spontaneously without difficulty.  Psychiatric:        Behavior: Behavior normal.     ED  Results / Procedures / Treatments   Labs (all labs ordered are listed, but only abnormal results are displayed) Labs Reviewed  COMPREHENSIVE METABOLIC PANEL - Abnormal; Notable for the following components:      Result Value   Glucose, Bld 103 (*)    Albumin 3.2 (*)    All other components within normal limits  SARS CORONAVIRUS 2 BY RT PCR (HOSPITAL ORDER, [facility])  CBC WITH DIFFERENTIAL/PLATELET  URINALYSIS, ROUTINE W REFLEX MICROSCOPIC  LIPASE, BLOOD  MAGNESIUM  TSH  BRAIN NATRIURETIC PEPTIDE  CBC  BASIC METABOLIC PANEL  TROPONIN I (HIGH SENSITIVITY)  TROPONIN I (HIGH SENSITIVITY)    EKG EKG Interpretation  Date/Time:  Tuesday Jan 12 2020 12:32:57 EDT Ventricular Rate:  156 PR Interval:    QRS Duration: 82 QT Interval:  296 QTC Calculation: 477 R Axis:   106 Text Interpretation: Atrial fibrillation with rapid V-rate Right axis deviation Low voltage, precordial leads Since last tracing rate faster Otherwise no significant change Confirmed by Daleen Bo [phone]) on 01/12/2020 12:40:08 PM   Radiology CT ABDOMEN PELVIS W CONTRAST  Result Date: 01/12/2020 CLINICAL DATA:  Left-sided abdominal pain EXAM: CT ABDOMEN AND PELVIS WITH CONTRAST TECHNIQUE: Multidetector CT imaging of the abdomen and pelvis was performed using the standard protocol following bolus administration of intravenous contrast. CONTRAST:  155mL OMNIPAQUE IOHEXOL 350 MG/ML SOLN COMPARISON:  None. FINDINGS: Lower chest: Mild dependent atelectatic changes are noted. Hepatobiliary: Fatty infiltration of the liver is noted. Cyst is noted within the caudate lobe measuring 2 cm. The gallbladder has been surgically removed. Pancreas: Unremarkable. No pancreatic ductal dilatation or surrounding inflammatory changes. Spleen: Normal in size without focal abnormality. Adrenals/Urinary Tract: Adrenal glands are within normal limits. Kidneys demonstrate a normal enhancement pattern. Small cysts  are noted on the left. No renal calculi or obstructive changes are seen. The bladder is partially distended. Stomach/Bowel: Diverticular change of the colon is noted with significant smooth muscle hypertrophy. Areas of inflammatory change are noted in the distal descending/proximal sigmoid colon consistent with diverticulitis. No definitive abscess or perforation is noted. Stomach is within normal limits. The small bowel is well visualized and within normal limits. Vascular/Lymphatic: Aortic atherosclerosis. No enlarged abdominal or pelvic lymph nodes. Reproductive: Status post hysterectomy. No adnexal masses. Other: Small fat containing umbilical hernia is noted. Musculoskeletal: Degenerative changes of lumbar spine are noted. IMPRESSION: Changes consistent with diverticulitis without abscess or perforation. Hepatic and renal cysts. Fatty liver. Electronically Signed  By: Inez Catalina M.D.   On: 01/12/2020 15:10   DG Chest Portable 1 View  Result Date: 01/12/2020 CLINICAL DATA:  AFib EXAM: PORTABLE CHEST 1 VIEW COMPARISON:  September 14, 2019 FINDINGS: There is unchanged cardiomegaly. Aortic knob calcifications. Mildly increased interstitial markings seen throughout both lungs. Overall shallow degree of aeration with crowding of the bronchovascular structures. No acute osseous abnormality. IMPRESSION: cardiomegaly and mild interstitial edema. Electronically Signed   By: Prudencio Pair M.D.   On: 01/12/2020 13:28    Procedures .Critical Care Performed by: Renita Papa, PA-C Authorized by: Renita Papa, PA-C   Critical care provider statement:    Critical care time (minutes):  50   Critical care was time spent personally by me on the following activities:  Discussions with consultants, evaluation of patient's response to treatment, examination of patient, ordering and performing treatments and interventions, ordering and review of laboratory studies, ordering and review of radiographic studies, pulse  oximetry, re-evaluation of patient's condition, obtaining history from patient or surrogate and review of old charts   (including critical care time)  Medications Ordered in ED Medications  diltiazem (CARDIZEM) 1 mg/mL load via infusion 15 mg (15 mg Intravenous Bolus from Bag 01/12/20 1319)    And  diltiazem (CARDIZEM) 125 mg in dextrose 5% 125 mL (1 mg/mL) infusion (5 mg/hr Intravenous New Bag/Given 01/12/20 1318)  donepezil (ARICEPT) tablet 5 mg (has no administration in time range)  memantine (NAMENDA XR) 24 hr capsule 28 mg (has no administration in time range)  enoxaparin (LOVENOX) injection 40 mg (has no administration in time range)  albuterol (PROVENTIL) (2.5 MG/3ML) 0.083% nebulizer solution 2.5 mg (has no administration in time range)  ondansetron (ZOFRAN) tablet 4 mg (has no administration in time range)    Or  ondansetron (ZOFRAN) injection 4 mg (has no administration in time range)  acetaminophen (TYLENOL) tablet 650 mg (has no administration in time range)    Or  acetaminophen (TYLENOL) suppository 650 mg (has no administration in time range)  sodium chloride flush (NS) 0.9 % injection 3 mL (has no administration in time range)  potassium chloride SA (KLOR-CON) CR tablet 40 mEq (has no administration in time range)  iohexol (OMNIPAQUE) 350 MG/ML injection 100 mL (100 mLs Intravenous Contrast Given 01/12/20 1445)  piperacillin-tazobactam (ZOSYN) IVPB 3.375 g (3.375 g Intravenous New Bag/Given 01/12/20 1552)    ED Course  I have reviewed the triage vital signs and the nursing notes.  Pertinent labs & imaging results that were available during my care of the patient were reviewed by me and considered in my medical decision making (see chart for details).    MDM Rules/Calculators/A&P                      Patient presenting for evaluation of left-sided abdominal pains and dizziness/lightheadedness.  Found to be in A. fib with RVR on initial presentation.  She is afebrile and  vital signs are otherwise stable.  She is nontoxic in appearance.  No rebound or guarding noted on examination of the abdomen.  Lab work reviewed and interpreted by myself shows no leukocytosis, no anemia, no metabolic derangements, no renal insufficiency.  Initial troponin is within normal limits.  EKG confirms A. fib with RVR.  She was given Cardizem bolus and placed on infusion for rate control with some improvement.  Imaging of the abdomen shows uncomplicated diverticulitis with no abscess or perforation.  Will start on IV Zosyn.  As this  is the patient's second presentation this year for A. fib with RVR secondary to infection/dehydration or other physiologic stressor I do feel that she warrants admission to the hospital for further evaluation and management.  Spoke with Dr. Tamala Julian with [facility] hospitalist service who agrees to assume care of patient and bring her into the hospital for admission.  I spoke with the patient's son over the phone who is in agreement with the plan at this time.  Final Clinical Impression(s) / ED Diagnoses Final diagnoses:  Atrial fibrillation with RVR [facility])  Diverticulitis    Rx / DC Orders ED Discharge Orders    None       Debroah Baller 01/12/20 1713    Daleen Bo, MD 01/12/20 1857

## 2020-01-12 NOTE — ED Triage Notes (Addendum)
Pt arrives to ED from [facility]- with c/o of left sided abd pain and told staff when she got up she feels dizzy. Alert and oriented to person only. Pt has hx of dementia as baseline.  NO hx of afib but ems reports afib on ekg.

## 2020-01-13 ENCOUNTER — Inpatient Hospital Stay (HOSPITAL_COMMUNITY): Payer: Medicare Other

## 2020-01-13 DIAGNOSIS — I4892 Unspecified atrial flutter: Secondary | ICD-10-CM

## 2020-01-13 DIAGNOSIS — I4891 Unspecified atrial fibrillation: Secondary | ICD-10-CM

## 2020-01-13 DIAGNOSIS — I480 Paroxysmal atrial fibrillation: Secondary | ICD-10-CM

## 2020-01-13 DIAGNOSIS — Z789 Other specified health status: Secondary | ICD-10-CM

## 2020-01-13 DIAGNOSIS — F0280 Dementia in other diseases classified elsewhere without behavioral disturbance: Secondary | ICD-10-CM

## 2020-01-13 DIAGNOSIS — F0390 Unspecified dementia without behavioral disturbance: Secondary | ICD-10-CM

## 2020-01-13 DIAGNOSIS — I361 Nonrheumatic tricuspid (valve) insufficiency: Secondary | ICD-10-CM

## 2020-01-13 DIAGNOSIS — I351 Nonrheumatic aortic (valve) insufficiency: Secondary | ICD-10-CM

## 2020-01-13 DIAGNOSIS — I5032 Chronic diastolic (congestive) heart failure: Secondary | ICD-10-CM

## 2020-01-13 DIAGNOSIS — Z7189 Other specified counseling: Secondary | ICD-10-CM

## 2020-01-13 DIAGNOSIS — Z66 Do not resuscitate: Secondary | ICD-10-CM

## 2020-01-13 DIAGNOSIS — K5792 Diverticulitis of intestine, part unspecified, without perforation or abscess without bleeding: Principal | ICD-10-CM

## 2020-01-13 DIAGNOSIS — I340 Nonrheumatic mitral (valve) insufficiency: Secondary | ICD-10-CM

## 2020-01-13 DIAGNOSIS — G309 Alzheimer's disease, unspecified: Secondary | ICD-10-CM

## 2020-01-13 LAB — CBC
HCT: 37.9 % (ref 36.0–46.0)
Hemoglobin: 12.7 g/dL (ref 12.0–15.0)
MCH: 29.7 pg (ref 26.0–34.0)
MCHC: 33.5 g/dL (ref 30.0–36.0)
MCV: 88.8 fL (ref 80.0–100.0)
Platelets: 290 10*3/uL (ref 150–400)
RBC: 4.27 MIL/uL (ref 3.87–5.11)
RDW: 13.2 % (ref 11.5–15.5)
WBC: 6.9 10*3/uL (ref 4.0–10.5)
nRBC: 0 % (ref 0.0–0.2)

## 2020-01-13 LAB — ECHOCARDIOGRAM COMPLETE: Weight: 2052.92 oz

## 2020-01-13 LAB — BASIC METABOLIC PANEL
Anion gap: 11 (ref 5–15)
BUN: 6 mg/dL — ABNORMAL LOW (ref 8–23)
CO2: 23 mmol/L (ref 22–32)
Calcium: 8.5 mg/dL — ABNORMAL LOW (ref 8.9–10.3)
Chloride: 104 mmol/L (ref 98–111)
Creatinine, Ser: 0.77 mg/dL (ref 0.44–1.00)
GFR calc Af Amer: >60 mL/min (ref >60)
GFR calc non Af Amer: >60 mL/min (ref >60)
Glucose, Bld: 111 mg/dL — ABNORMAL HIGH (ref 70–99)
Potassium: 4 mmol/L (ref 3.5–5.1)
Sodium: 138 mmol/L (ref 135–145)

## 2020-01-13 MED ORDER — PIPERACILLIN-TAZOBACTAM 3.375 G IVPB
3.3750 g | Freq: Three times a day (TID) | INTRAVENOUS | Status: DC
  Administered 2020-01-13 – 2020-01-14 (×4): 3.375 g via INTRAVENOUS
  Filled 2020-01-13 (×5): qty 50

## 2020-01-13 MED ORDER — FUROSEMIDE 10 MG/ML IJ SOLN
40.0000 mg | Freq: Every day | INTRAMUSCULAR | Status: DC
  Administered 2020-01-13 – 2020-01-14 (×2): 40 mg via INTRAVENOUS
  Filled 2020-01-13 (×2): qty 4

## 2020-01-13 MED ORDER — DILTIAZEM HCL 60 MG PO TABS
30.0000 mg | ORAL_TABLET | Freq: Three times a day (TID) | ORAL | Status: DC
  Administered 2020-01-13 – 2020-01-14 (×4): 30 mg via ORAL
  Filled 2020-01-13 (×4): qty 1

## 2020-01-13 NOTE — TOC Initial Note (Signed)
Transition of Care [facility]) - Initial/Assessment Note    Patient Details  Name: Lorine Wollan MRN: [id number] Date of Birth: 06-27-1928  [facility] CM/SW Contact:    Benard Halsted, LCSW Phone Number: 01/13/2020, 4:23 PM  Clinical Narrative:                 CSW spoke with patient's son, [name], regarding patient's discharge plan. He confirmed that patient will return to [facility] memory care and he would like to transport her back by car. CSW contacted RN, Delcie Roch, at [facility] who confirmed return and requests FL2, DC summary, and scripts for any new medications (fax: [phone]). CSW will continue to follow.   Expected Discharge Plan: Memory Care Barriers to Discharge: Continued Medical Work up   Patient Goals and CMS Choice Patient states their goals for this hospitalization and ongoing recovery are:: return to memory care CMS Medicare.gov Compare Post Acute Care list provided to:: Patient Represenative (must comment)(son) Choice offered to / list presented to : Adult Children  Expected Discharge Plan and Services Expected Discharge Plan: Memory Care In-house Referral: Clinical Social Work     Living arrangements for the past 2 months: [place]                                      Prior Living Arrangements/Services Living arrangements for the past 2 months: [place] Lives with:: Facility Resident Patient language and need for interpreter reviewed:: Yes Do you feel safe going back to the place where you live?: Yes      Need for Family Participation in Patient Care: Yes (Comment) Care giver support system in place?: Yes (comment)   Criminal Activity/Legal Involvement Pertinent to Current Situation/Hospitalization: No - Comment as needed  Activities of Daily Living      Permission Sought/Granted Permission sought to share information with : Facility Sport and exercise psychologist, Family Supports Permission granted  to share information with : No  Share Information with NAME: [name]  Permission granted to share info w AGENCY: [name]  Permission granted to share info w Relationship: Son  Permission granted to share info w Contact Information: [phone]  Emotional Assessment Appearance:: Appears stated age Attitude/Demeanor/Rapport: Unable to Assess Affect (typically observed): Unable to Assess Orientation: : Oriented to Self Alcohol / Substance Use: Not Applicable Psych Involvement: No (comment)  Admission diagnosis:  Diverticulitis [K57.92] Atrial fibrillation with RVR ([place]) [I48.91] Patient Active Problem List   Diagnosis Date Noted  . Diverticulitis 01/12/2020  . AF (paroxysmal atrial fibrillation) ([place]) 01/12/2020  . Dementia without behavioral disturbance ([place]) 01/12/2020  . Chronic diastolic CHF (congestive heart failure) ([place]) 01/12/2020   PCP:  Lajean Manes, MD Pharmacy:  No Pharmacies Listed    Social Determinants of Health (SDOH) Interventions    Readmission Risk Interventions No flowsheet data found.

## 2020-01-13 NOTE — Progress Notes (Signed)
Echocardiogram 2D Echocardiogram has been performed.  Chyanna Kuenzel Lajune Perine 01/13/2020, 12:19 PM

## 2020-01-13 NOTE — Consult Note (Addendum)
Cardiology Consultation:   Patient ID: Dawn Buchanan; QI:[id number]; [birth year]   Admit date: 01/12/2020 Date of Consult: 01/13/2020  Primary Care Provider: Lajean Manes, MD Primary Cardiologist: New to [facility]  Patient Profile:   Dawn Buchanan is a 86 y.o. female with a hx of paroxysmal atrial fibrillation not on anticoagulation, hypertension, and dementia who is being seen today for the evaluation of presumed atrial fibrillation with RVR at the request of Dr. Tamala Julian.  History of Present Illness:   Dawn Buchanan an 86 year old female with a history stated above who presented to [facility] on 01/12/2020 with abdominal pain and dizziness per chart review.  HPI obtained per chart review the patient is a poor historian secondary to dementia (oriented to self at baseline).  Per chart review, family were visiting over the weekend which time the patient had complaints of abdominal pain which was noted again while at the facility. On day of presentation, patient had reports of dizziness with standing therefore EMS was called for transport to the ED for further evaluation.    On ED presentation, EKG presumably revealed atrial fibrillation with heart rates in the low 100s to the 150s however on my interpretation it appears the EKG shows atrial tachycardia/possibly atrial flutter? CXR with cardiomegaly and mild interstitial edema. Abdominal CT performed in the setting of recent complaint of abdominal pain which showed diverticulitis without signs of abscess or perforation.  She was started on empiric Zosyn as well as IV diltiazem infusion for elevated rate. BNP was found to be markedly elevated at 501.  Creatinine stable at 0.77. TSH within normal limits at 2.065. Echocardiogram 05/15/2015 ordered by her PCP which showed a normal LV function at 60 to 65% with no regional wall motion abnormalities and grade 2 DD, aortic sclerosis without stenosis, mild to moderate mitral regurgitation.  Repeat echocardiogram has been  ordered however studies pending.  Per chart review, she was recently seen in the ED 08/2019 with similar symptoms of abdominal pain found to be positive for COVID-19 with new onset atrial fibrillation with RVR (on EKG). She was treated with diltiazem and felt to be a poor candidate for anticoagulation secondary to history of dementia and increased risk of falls.  Unclear of her discharge medications as this is not on her PTA reconciliation list. Plan was to follow-up in the A. fib clinic however this was never done.  On my interview, she denies specific pain. Appears comfortable. Alert and oriented to self only. On telemetry review, she has converted to NSR with good rate control. Is being transitioned off infusion to PO dosing.   Past Medical History:  Diagnosis Date   Dementia ([place])    Hypertension     History reviewed. No pertinent surgical history.   Prior to Admission medications   Medication Sig Start Date End Date Taking? Authorizing Provider  donepezil (ARICEPT) 5 MG tablet Take 5 mg by mouth at bedtime.    Yes [provider]  memantine (NAMENDA XR) 28 MG CP24 24 hr capsule Take 1 capsule by mouth daily.   Yes [provider]  Mineral Oil OIL Place 2 drops into both ears once a week.   Yes [provider]    Inpatient Medications: Scheduled Meds:  diltiazem  30 mg Oral Q8H   donepezil  5 mg Oral QHS   enoxaparin (LOVENOX) injection  40 mg Subcutaneous Q24H   memantine  28 mg Oral Daily   sodium chloride flush  3 mL Intravenous Q12H  Continuous Infusions:  diltiazem (CARDIZEM) infusion 5 mg/hr (01/12/20 1318)   piperacillin-tazobactam (ZOSYN)  IV 3.375 g (01/13/20 0205)   PRN Meds: acetaminophen **OR** acetaminophen, albuterol, haloperidol lactate, labetalol, ondansetron **OR** ondansetron (ZOFRAN) IV  Allergies:   No Known Allergies  Social History:   Social History   Socioeconomic History   Marital status: Widowed    Spouse name: Not on  file   Number of children: Not on file   Years of education: Not on file   Highest education level: Not on file  Occupational History   Not on file  Tobacco Use   Smoking status: Never Smoker   Smokeless tobacco: Never Used  Substance and Sexual Activity   Alcohol use: Not on file   Drug use: Not on file   Sexual activity: Not on file  Other Topics Concern   Not on file  Social History Narrative   Not on file   Social Determinants of Health   Financial Resource Strain:    Difficulty of Paying Living Expenses:   Food Insecurity:    Worried About [place] in the Last Year:    Human resources officer of Food in the Last Year:   Transportation Needs:    Film/video editor (Medical):    Lack of Transportation (Non-Medical):   Physical Activity:    Days of Exercise per Week:    Minutes of Exercise per Session:   Stress:    Feeling of Stress :   Social Connections:    Frequency of Communication with Friends and Family:    Frequency of Social Gatherings with Friends and Family:    Attends Religious Services:    Active Member of Clubs or Organizations:    Attends Music therapist:    Marital Status:   Intimate Partner Violence:    Fear of Current or Ex-Partner:    Emotionally Abused:    Physically Abused:    Sexually Abused:     Family History:   No family history on file. Family Status:  No family status information on file.    ROS:  Please see the history of present illness.  All other ROS reviewed and negative.     Physical Exam/Data:   Vitals:   01/12/20 2301 01/12/20 2326 01/13/20 0438 01/13/20 0731  BP: (!) 150/103 (!) 143/88 117/65 125/82  Pulse: 65 81 67 70  Resp: 17 18 16 20   Temp: 98 F (36.7 C)  98.1 F (36.7 C) 97.9 F (36.6 C)  TempSrc: Oral  Oral Oral  SpO2: 97% 96% 94% 97%  Weight:   58.2 kg     Intake/Output Summary (Last 24 hours) at 01/13/2020 0937 Last data filed at 01/13/2020 0447 Gross per 24 hour  Intake 161.61 ml    Output --  Net 161.61 ml   Filed Weights   01/13/20 0438  Weight: 58.2 kg   Body mass index is 22.73 kg/m.   General: Elderly, NAD Skin: Warm, dry, intact  Neck: Negative for carotid bruits. No JVD Lungs:Clear to ausculation bilaterally. No wheezes, rales, or rhonchi. Breathing is unlabored. Cardiovascular: RRR with S1 S2. No murmurs Abdomen: Soft, non-tender, non-distended. No obvious abdominal masses. Extremities: No edema.  Radial pulses 2+ bilaterally Neuro: Alert and oriented to self only. No focal deficits. No facial asymmetry. MAE spontaneously. Psych: Responds to questions somewhat appropriately with normal affect.     EKG:  The EKG was personally reviewed and demonstrates: 01/12/2020 atrial tachycardia/SVT?,  HR 1 3 6  bpm.  Does not appear to be atrial fibrillation with RVR Telemetry:  Telemetry was personally reviewed and demonstrates: 01/13/2020 NSR with rates in the 70s  Relevant CV Studies:  Echocardiogram 05/16/2015:  Study Conclusions   - Left ventricle: The cavity size was normal. Wall thickness was    increased in a pattern of mild LVH. Systolic function was normal.    The estimated ejection fraction was in the range of 60% to 65%.    Wall motion was normal; there were no regional wall motion    abnormalities. Features are consistent with a pseudonormal left    ventricular filling pattern, with concomitant abnormal relaxation    and increased filling pressure (grade 2 diastolic dysfunction).  - Aortic valve: Sclerosis without stenosis.  - Mitral valve: There was mild to moderate regurgitation directed    centrally.  - Left atrium: The atrium was moderately dilated.   Laboratory Data:  Chemistry Recent Labs  Lab 01/12/20 1300 01/13/20 0411  NA 139 138  K 3.5 4.0  CL 104 104  CO2 24 23  GLUCOSE 103* 111*  BUN 8 6*  CREATININE 0.75 0.77  CALCIUM 8.9 8.5*  GFRNONAA >60 >60  GFRAA >60 >60  ANIONGAP 11 11    Total Protein  Date Value Ref Range  Status  01/12/2020 6.9 6.5 - 8.1 g/dL Final   Albumin  Date Value Ref Range Status  01/12/2020 3.2 (L) 3.5 - 5.0 g/dL Final   AST  Date Value Ref Range Status  01/12/2020 15 15 - 41 U/L Final   ALT  Date Value Ref Range Status  01/12/2020 13 0 - 44 U/L Final   Alkaline Phosphatase  Date Value Ref Range Status  01/12/2020 64 38 - 126 U/L Final   Total Bilirubin  Date Value Ref Range Status  01/12/2020 1.2 0.3 - 1.2 mg/dL Final   Hematology Recent Labs  Lab 01/12/20 1300 01/13/20 0411  WBC 9.7 6.9  RBC 4.89 4.27  HGB 14.1 12.7  HCT 44.2 37.9  MCV 90.4 88.8  MCH 28.8 29.7  MCHC 31.9 33.5  RDW 13.2 13.2  PLT 292 290   Cardiac EnzymesNo results for input(s): TROPONINI in the last 168 hours. No results for input(s): TROPIPOC in the last 168 hours.  BNP Recent Labs  Lab 01/12/20 1805  BNP 551.2*    DDimer No results for input(s): DDIMER in the last 168 hours. TSH:  Lab Results  Component Value Date   TSH 2.065 01/12/2020   Lipids:No results found for: CHOL, HDL, LDLCALC, LDLDIRECT, TRIG, CHOLHDL HgbA1c:No results found for: HGBA1C  Radiology/Studies:  CT ABDOMEN PELVIS W CONTRAST  Result Date: 01/12/2020 CLINICAL DATA:  Left-sided abdominal pain EXAM: CT ABDOMEN AND PELVIS WITH CONTRAST TECHNIQUE: Multidetector CT imaging of the abdomen and pelvis was performed using the standard protocol following bolus administration of intravenous contrast. CONTRAST:  170mL OMNIPAQUE IOHEXOL 350 MG/ML SOLN COMPARISON:  None. FINDINGS: Lower chest: Mild dependent atelectatic changes are noted. Hepatobiliary: Fatty infiltration of the liver is noted. Cyst is noted within the caudate lobe measuring 2 cm. The gallbladder has been surgically removed. Pancreas: Unremarkable. No pancreatic ductal dilatation or surrounding inflammatory changes. Spleen: Normal in size without focal abnormality. Adrenals/Urinary Tract: Adrenal glands are within normal limits. Kidneys demonstrate a normal  enhancement pattern. Small cysts are noted on the left. No renal calculi or obstructive changes are seen. The bladder is partially distended. Stomach/Bowel: Diverticular change of the colon is noted with significant smooth muscle hypertrophy.  Areas of inflammatory change are noted in the distal descending/proximal sigmoid colon consistent with diverticulitis. No definitive abscess or perforation is noted. Stomach is within normal limits. The small bowel is well visualized and within normal limits. Vascular/Lymphatic: Aortic atherosclerosis. No enlarged abdominal or pelvic lymph nodes. Reproductive: Status post hysterectomy. No adnexal masses. Other: Small fat containing umbilical hernia is noted. Musculoskeletal: Degenerative changes of lumbar spine are noted. IMPRESSION: Changes consistent with diverticulitis without abscess or perforation. Hepatic and renal cysts. Fatty liver. Electronically Signed   By: Inez Catalina M.D.   On: 01/12/2020 15:10   DG Chest Portable 1 View  Result Date: 01/12/2020 CLINICAL DATA:  AFib EXAM: PORTABLE CHEST 1 VIEW COMPARISON:  September 14, 2019 FINDINGS: There is unchanged cardiomegaly. Aortic knob calcifications. Mildly increased interstitial markings seen throughout both lungs. Overall shallow degree of aeration with crowding of the bronchovascular structures. No acute osseous abnormality. IMPRESSION: cardiomegaly and mild interstitial edema. Electronically Signed   By: Prudencio Pair M.D.   On: 01/12/2020 13:28    Assessment and Plan:   1.  Paroxysmal atrial fibrillation with RVR: -Patient was seen in the ED 08/2019 in the setting of abdominal pain found to be COVID-19 positive with EKG/telemetry with new onset atrial fibrillation with RVR.  She was treated with diltiazem and no anticoagulation secondary to history of dementia and increased risk of falls.  -Plan was to follow-up in the A. fib clinic however this was never done. -She presents once again 01/12/20 with  abdominal pain at which time an EKG presumably showed atrial fibrillation with heart rates in the low 100s to the 150s -CXR with cardiomegaly and mild interstitial edema   -Abdominal CT performed in the setting of recent complaint of abdominal pain which showed diverticulitis without signs of abscess or perforation.  -Empiric Zosyn as well as IV diltiazem>> transition to p.o. diltiazem 30 mg every 8 hour for elevated rates.   -No anticoagulation secondary to dementia and high fall risk -BNP was found to be markedly elevated at 501.   -Creatinine stable at 0.77>>TSH within normal limits at 2.065. -Patient has since converted to NSR with rates in the 70s. P.o. diltiazem 30 mg every 8 hours initiated -We will plan on transition to diltiazem CD 90 mg p.o. daily for ease of administration -Echo pending -CHA2DS2VASc =5 (age, female, CHF, HTN)  2.  Acute on chronic diastolic CHF:  -Echocardiogram 05/15/2015 ordered by her PCP which showed a normal LV function at 60 to 65% with no regional wall motion abnormalities and grade 2 DD, aortic sclerosis without stenosis, mild to moderate mitral regurgitation.  Repeat echocardiogram has been ordered however studies pending. -BNP elevated at 501 with CXR consistent with fluid volume overload -Repeat echocardiogram ordered however not yet performed -We will add IV Lasix 40 mg daily for fluid volume overload -Follow response closely -Weight, 128lb -I&O, net +161 mL -Daily weight, strict I&O  3.  Mild to moderate mitral regurgitation: -Found on last echocardiogram from 05/15/2015 with no repeat study since that time -Echocardiogram ordered however not yet performed  4.  Aortic sclerosis without stenosis: -As above, found on previous echocardiogram -Follow after echo performed -Presented with dizziness however no syncope -Dizziness could be in setting of #1  5.  Progressive dementia: -Continue Namenda, Aricept -Management per primary team -Oriented to  person only   For questions or updates, please contact [place] Please consult [email or web address] for contact info under Cardiology/STEMI.   Signed, Kathyrn Drown NP-C [place] Pager: [phone]  01/13/2020 9:37 AM

## 2020-01-13 NOTE — Progress Notes (Signed)
PROGRESS NOTE  Dawn Buchanan [id number] DOB: Nov 17, 1927   PCP: Lajean Manes, MD  Patient is from: [place] memory care unit  DOA: 01/12/2020 LOS: 1  Brief Narrative / Interim history: 86 year old female with history of advanced dementia, paroxysmal A. fib not on AC, HTN and COVID-19 in 08/2019 presenting with complaints of left-sided abdominal pain and  Dizziness and admitted for A. fib with RVR and acute diverticulitis. Patient was started on IV Cardizem drip and IV Zosyn.   The next day, she converted back to sinus rhythm.  She was transitioned to p.o. Cardizem.   Subjective: Seen and examined earlier this morning.  No major events overnight or this morning.  She converted to sinus rhythm this morning.  No complaints but not a reliable historian.  She is only oriented to self.  Objective: Vitals:   01/12/20 2301 01/12/20 2326 01/13/20 0438 01/13/20 0731  BP: (!) 150/103 (!) 143/88 117/65 125/82  Pulse: 65 81 67 70  Resp: 17 18 16 20   Temp: 98 F (36.7 C)  98.1 F (36.7 C) 97.9 F (36.6 C)  TempSrc: Oral  Oral Oral  SpO2: 97% 96% 94% 97%  Weight:   58.2 kg     Intake/Output Summary (Last 24 hours) at 01/13/2020 1452 Last data filed at 01/13/2020 0447 Gross per 24 hour  Intake 161.61 ml  Output -  Net 161.61 ml   Filed Weights   01/13/20 0438  Weight: 58.2 kg    Examination:  GENERAL: No apparent distress.  Nontoxic. HEENT: MMM.  Vision and hearing grossly intact.  NECK: Supple.  No apparent JVD.  RESP:  No IWOB.  Fair aeration bilaterally. CVS:  RRR. Heart sounds normal.  ABD/GI/GU: BS+. Abd soft.  Mild tenderness over LLQ.  No rebound or guarding. MSK/EXT:  Moves extremities. No apparent deformity. No edema.  SKIN: no apparent skin lesion or wound NEURO: Awake, alert and oriented appropriately.  No apparent focal neuro deficit. PSYCH: Calm. Normal affect.  Procedures:  None  Microbiology summarized: COVID-19 PCR negative.  Assessment & Plan: A. fib  with RVR: Converted to sinus rhythm now.  CHA2DS2-VASc score greater than 3. -Transition to p.o. Cardizem 30 mg every 8 hours -Not on anticoagulation due to risk of fall-patient son in agreement. -Cardiology following. -Follow echocardiogram  Acute uncomplicated diverticulitis-CT abdomen and pelvis revealed areas of inflammatory changes in the distal descending and proximal sigmoid colon without abscess or perforation.  No leukocytosis.  Hemodynamically stable. -Continue IV Zosyn.  Will change to p.o. Augmentin in the morning -Seems to be doing well with current diet-we will continue  Acute on chronic diastolic CHF?  No dyspnea or chest pain.  No signs of fluid overload.   Elevated BNP 551 (no prior values). CXR with cardiomegaly and mild interstitial edema Echo in 2016 with EF of 60 to 65%, G2-DD.  -Lasix per cardiology. She is high risk for dehydration.  -Follow echocardiogram here -Monitor fluid status and renal functions  Alzheimer's dementia without behavioral disturbance -Continue home Aricept and memantine -As needed Haldol for agitation  Essential hypertension: Normotensive.  Not on medication at home. -Cardizem as above.  History of COVID-19 infection in 08/2019 -No respiratory issues.  Goal of care discussion: Patient with history of severe Alzheimer's dementia.  Advanced age.  Chronic comorbidities as above.  Discussed about goal of care and CODE STATUS with patient's son, [name] over the phone.  She was admitted as full code. However, [name] tells me she had DNR form at memory  care unit. Per [name], [name] always wanted to sleep peacefully when it is time.  -I have changed CODE STATUS to DNR/DNI with [name]'s permission.         DVT prophylaxis: Subcu Lovenox Code Status: DNR/DNI Family Communication: Updated patient's son over the phone. Status is: Inpatient  Remains inpatient appropriate because:IV treatments appropriate due to intensity of illness or inability to  take PO   Dispo: The patient is from: [facility] memory care unit              Anticipated d/c is to: [facility] memory care unit              Anticipated d/c date is: 1 day              Patient currently is not medically stable to d/c.        Consultants:  Cardiology   Sch Meds:  Scheduled Meds: . diltiazem  30 mg Oral Q8H  . donepezil  5 mg Oral QHS  . enoxaparin (LOVENOX) injection  40 mg Subcutaneous Q24H  . furosemide  40 mg Intravenous Daily  . memantine  28 mg Oral Daily  . sodium chloride flush  3 mL Intravenous Q12H   Continuous Infusions: . diltiazem (CARDIZEM) infusion Stopped (01/13/20 1047)  . piperacillin-tazobactam (ZOSYN)  IV 3.375 g (01/13/20 1443)   PRN Meds:.acetaminophen **OR** acetaminophen, albuterol, haloperidol lactate, labetalol, ondansetron **OR** ondansetron (ZOFRAN) IV  Antimicrobials: Anti-infectives (From admission, onward)   Start     Dose/Rate Route Frequency Ordered Stop   01/13/20 0200  piperacillin-tazobactam (ZOSYN) IVPB 3.375 g     3.375 g 12.5 mL/hr over 240 Minutes Intravenous Every 8 hours 01/13/20 0112     01/12/20 1545  piperacillin-tazobactam (ZOSYN) IVPB 3.375 g     3.375 g 100 mL/hr over 30 Minutes Intravenous  Once 01/12/20 1535 01/12/20 1711       I have personally reviewed the following labs and images: CBC: Recent Labs  Lab 01/12/20 1300 01/13/20 0411  WBC 9.7 6.9  NEUTROABS 7.2  --   HGB 14.1 12.7  HCT 44.2 37.9  MCV 90.4 88.8  PLT 292 290   BMP &GFR Recent Labs  Lab 01/12/20 1300 01/12/20 1805 01/13/20 0411  NA 139  --  138  K 3.5  --  4.0  CL 104  --  104  CO2 24  --  23  GLUCOSE 103*  --  111*  BUN 8  --  6*  CREATININE 0.75  --  0.77  CALCIUM 8.9  --  8.5*  MG  --  1.9  --    Estimated Creatinine Clearance: 39.4 mL/min (by C-G formula based on SCr of 0.77 mg/dL). Liver & Pancreas: Recent Labs  Lab 01/12/20 1300  AST 15  ALT 13  ALKPHOS 64  BILITOT 1.2  PROT 6.9  ALBUMIN 3.2*    Recent Labs  Lab 01/12/20 1300  LIPASE 29   No results for input(s): AMMONIA in the last 168 hours. Diabetic: No results for input(s): HGBA1C in the last 72 hours. No results for input(s): GLUCAP in the last 168 hours. Cardiac Enzymes: No results for input(s): CKTOTAL, CKMB, CKMBINDEX, TROPONINI in the last 168 hours. No results for input(s): PROBNP in the last 8760 hours. Coagulation Profile: No results for input(s): INR, PROTIME in the last 168 hours. Thyroid Function Tests: Recent Labs    01/12/20 1805  TSH 2.065   Lipid Profile: No results for input(s): CHOL, HDL, LDLCALC,  TRIG, CHOLHDL, LDLDIRECT in the last 72 hours. Anemia Panel: No results for input(s): VITAMINB12, FOLATE, FERRITIN, TIBC, IRON, RETICCTPCT in the last 72 hours. Urine analysis:    Component Value Date/Time   COLORURINE YELLOW 01/12/2020 1500   APPEARANCEUR CLEAR 01/12/2020 1500   LABSPEC 1.028 01/12/2020 1500   PHURINE 6.0 01/12/2020 1500   GLUCOSEU NEGATIVE 01/12/2020 1500   HGBUR NEGATIVE 01/12/2020 1500   BILIRUBINUR NEGATIVE 01/12/2020 1500   KETONESUR NEGATIVE 01/12/2020 1500   PROTEINUR NEGATIVE 01/12/2020 1500   NITRITE NEGATIVE 01/12/2020 1500   LEUKOCYTESUR NEGATIVE 01/12/2020 1500   Sepsis Labs: Invalid input(s): PROCALCITONIN, [place]  Microbiology: Recent Results (from the past 240 hour(s))  SARS Coronavirus 2 by RT PCR (hospital order, performed in [facility] hospital lab) Nasopharyngeal Nasopharyngeal Swab     Status: None   Collection Time: 01/12/20  3:54 PM   Specimen: Nasopharyngeal Swab  Result Value Ref Range Status   SARS Coronavirus 2 NEGATIVE NEGATIVE Final    Comment: (NOTE) SARS-CoV-2 target nucleic acids are NOT DETECTED. The SARS-CoV-2 RNA is generally detectable in upper and lower respiratory specimens during the acute phase of infection. The lowest concentration of SARS-CoV-2 viral copies this assay can detect is 250 copies / mL. A negative result does not  preclude SARS-CoV-2 infection and should not be used as the sole basis for treatment or other patient management decisions.  A negative result may occur with improper specimen collection / handling, submission of specimen other than nasopharyngeal swab, presence of viral mutation(s) within the areas targeted by this assay, and inadequate number of viral copies (<250 copies / mL). A negative result must be combined with clinical observations, patient history, and epidemiological information. Fact Sheet for Patients:   StrictlyIdeas.no Fact Sheet for Healthcare Providers: BankingDealers.co.za This test is not yet approved or cleared  by the [place] FDA and has been authorized for detection and/or diagnosis of SARS-CoV-2 by FDA under an Emergency Use Authorization (EUA).  This EUA will remain in effect (meaning this test can be used) for the duration of the COVID-19 declaration under Section 564(b)(1) of the Act, 21 U.S.C. section 360bbb-3(b)(1), unless the authorization is terminated or revoked sooner. Performed at [facility], [place] [address] [place], [place] [address]     Radiology Studies: CT ABDOMEN PELVIS W CONTRAST  Result Date: 01/12/2020 CLINICAL DATA:  Left-sided abdominal pain EXAM: CT ABDOMEN AND PELVIS WITH CONTRAST TECHNIQUE: Multidetector CT imaging of the abdomen and pelvis was performed using the standard protocol following bolus administration of intravenous contrast. CONTRAST:  124mL OMNIPAQUE IOHEXOL 350 MG/ML SOLN COMPARISON:  None. FINDINGS: Lower chest: Mild dependent atelectatic changes are noted. Hepatobiliary: Fatty infiltration of the liver is noted. Cyst is noted within the caudate lobe measuring 2 cm. The gallbladder has been surgically removed. Pancreas: Unremarkable. No pancreatic ductal dilatation or surrounding inflammatory changes. Spleen: Normal in size without focal abnormality. Adrenals/Urinary  Tract: Adrenal glands are within normal limits. Kidneys demonstrate a normal enhancement pattern. Small cysts are noted on the left. No renal calculi or obstructive changes are seen. The bladder is partially distended. Stomach/Bowel: Diverticular change of the colon is noted with significant smooth muscle hypertrophy. Areas of inflammatory change are noted in the distal descending/proximal sigmoid colon consistent with diverticulitis. No definitive abscess or perforation is noted. Stomach is within normal limits. The small bowel is well visualized and within normal limits. Vascular/Lymphatic: Aortic atherosclerosis. No enlarged abdominal or pelvic lymph nodes. Reproductive: Status post hysterectomy. No adnexal masses.  Other: Small fat containing umbilical hernia is noted. Musculoskeletal: Degenerative changes of lumbar spine are noted. IMPRESSION: Changes consistent with diverticulitis without abscess or perforation. Hepatic and renal cysts. Fatty liver. Electronically Signed   By: Inez Catalina M.D.   On: 01/12/2020 15:10      Taye T. Emerson  If 7PM-7AM, please contact night-coverage [email or web address] Password [facility] 01/13/2020, 2:52 PM

## 2020-01-14 DIAGNOSIS — R5381 Other malaise: Secondary | ICD-10-CM

## 2020-01-14 LAB — RENAL FUNCTION PANEL
Albumin: 3 g/dL — ABNORMAL LOW (ref 3.5–5.0)
Anion gap: 14 (ref 5–15)
BUN: 8 mg/dL (ref 8–23)
CO2: 23 mmol/L (ref 22–32)
Calcium: 8.5 mg/dL — ABNORMAL LOW (ref 8.9–10.3)
Chloride: 101 mmol/L (ref 98–111)
Creatinine, Ser: 0.88 mg/dL (ref 0.44–1.00)
GFR calc Af Amer: >60 mL/min (ref >60)
GFR calc non Af Amer: 58 mL/min — ABNORMAL LOW (ref >60)
Glucose, Bld: 107 mg/dL — ABNORMAL HIGH (ref 70–99)
Phosphorus: 3.6 mg/dL (ref 2.5–4.6)
Potassium: 3.5 mmol/L (ref 3.5–5.1)
Sodium: 138 mmol/L (ref 135–145)

## 2020-01-14 LAB — MAGNESIUM: Magnesium: 1.8 mg/dL (ref 1.7–2.4)

## 2020-01-14 MED ORDER — AMOXICILLIN-POT CLAVULANATE 875-125 MG PO TABS
1.0000 | ORAL_TABLET | Freq: Two times a day (BID) | ORAL | 0 refills | Status: AC
Start: 2020-01-14 — End: 2020-01-22

## 2020-01-14 MED ORDER — FUROSEMIDE 20 MG PO TABS: 20.0000 mg | ORAL_TABLET | Freq: Every day | ORAL | 2 refills | Status: DC

## 2020-01-14 MED ORDER — DILTIAZEM HCL ER COATED BEADS 120 MG PO CP24: 120.0000 mg | ORAL_CAPSULE | Freq: Every day | ORAL | 1 refills | Status: AC

## 2020-01-14 MED ORDER — DILTIAZEM HCL ER COATED BEADS 120 MG PO CP24
120.0000 mg | ORAL_CAPSULE | Freq: Every day | ORAL | Status: DC
  Administered 2020-01-14: 120 mg via ORAL
  Filled 2020-01-14: qty 1

## 2020-01-14 NOTE — NC FL2 (Addendum)
[place] LEVEL OF CARE SCREENING TOOL     IDENTIFICATION  Patient Name: Dawn Buchanan Birthdate: 1928-04-10 Sex: female Admission Date (Current Location): 01/12/2020  [facility] and [place] Number:  Herbalist and Address:  The . [facility], [place] [address] [place], [place] [address]      Provider Number: [id number]  Attending Physician Name and Address:  Mercy Riding, MD  Relative Name and Phone Number:  [name], son, [phone]    Current Level of Care: Hospital Recommended Level of Care: Memory Care Prior Approval Number:    Date Approved/Denied:   PASRR Number:    Discharge Plan: Other (Comment)(Memory Care)    Current Diagnoses: Patient Active Problem List   Diagnosis Date Noted  . Diverticulitis 01/12/2020  . AF (paroxysmal atrial fibrillation) ([place]) 01/12/2020  . Dementia without behavioral disturbance ([place]) 01/12/2020  . Chronic diastolic CHF (congestive heart failure) ([place]) 01/12/2020    Orientation RESPIRATION BLADDER Height & Weight     Self  Normal Continent Weight: 122 lb 5.7 oz (55.5 kg) Height:     BEHAVIORAL SYMPTOMS/MOOD NEUROLOGICAL BOWEL NUTRITION STATUS      Continent Diet(No added salt)  AMBULATORY STATUS COMMUNICATION OF NEEDS Skin   Limited Assist Verbally Normal                       Personal Care Assistance Level of Assistance  Bathing, Feeding, Dressing Bathing Assistance: Limited assistance Feeding assistance: Limited assistance Dressing Assistance: Limited assistance     Functional Limitations Info  Sight, Hearing, Speech Sight Info: Adequate Hearing Info: Adequate Speech Info: Adequate    SPECIAL CARE FACTORS FREQUENCY                       Contractures Contractures Info: Not present    Additional Factors Info  Code Status, Allergies, Psychotropic Code Status Info: DNR Allergies Info: NKA Psychotropic Info: Aricept         Current Medications (01/14/2020):   This is the current hospital active medication list Current Facility-Administered Medications  Medication Dose Route Frequency Provider Last Rate Last Admin  . acetaminophen (TYLENOL) tablet 650 mg  650 mg Oral Q6H PRN Norval Morton, MD       Or  . acetaminophen (TYLENOL) suppository 650 mg  650 mg Rectal Q6H PRN Smith, Rondell A, MD      . albuterol (PROVENTIL) (2.5 MG/3ML) 0.083% nebulizer solution 2.5 mg  2.5 mg Nebulization Q6H PRN Smith, Rondell A, MD      . diltiazem (CARDIZEM CD) 24 hr capsule 120 mg  120 mg Oral Daily Wendee Beavers T, MD   120 mg at 01/14/20 1126  . donepezil (ARICEPT) tablet 5 mg  5 mg Oral QHS Smith, Rondell A, MD   5 mg at 01/13/20 2231  . enoxaparin (LOVENOX) injection 40 mg  40 mg Subcutaneous Q24H Smith, Rondell A, MD   40 mg at 01/13/20 1739  . furosemide (LASIX) injection 40 mg  40 mg Intravenous Daily Kathyrn Drown D, NP   40 mg at 01/14/20 1126  . labetalol (NORMODYNE) injection 10 mg  10 mg Intravenous Q2H PRN Lang Snow, FNP   10 mg at 01/12/20 2214  . memantine (NAMENDA XR) 24 hr capsule 28 mg  28 mg Oral Daily Smith, Rondell A, MD   28 mg at 01/14/20 1126  . ondansetron (ZOFRAN) tablet 4 mg  4 mg Oral Q6H PRN Fuller Plan  A, MD       Or  . ondansetron (ZOFRAN) injection 4 mg  4 mg Intravenous Q6H PRN Smith, Rondell A, MD      . piperacillin-tazobactam (ZOSYN) IVPB 3.375 g  3.375 g Intravenous Q8H Lang Snow, FNP 12.5 mL/hr at 01/14/20 0518 3.375 g at 01/14/20 0518  . sodium chloride flush (NS) 0.9 % injection 3 mL  3 mL Intravenous Q12H Smith, Rondell A, MD   3 mL at 01/14/20 1132     Discharge Medications:    amoxicillin-clavulanate 875-125 MG tablet Commonly known as: Augmentin Take 1 tablet by mouth 2 (two) times daily for 8 days.   diltiazem 120 MG 24 hr capsule Commonly known as: CARDIZEM CD Take 1 capsule (120 mg total) by mouth daily.   donepezil 5 MG tablet Commonly known as: ARICEPT Take 5 mg by mouth at bedtime.    furosemide 20 MG tablet Commonly known as: LASIX Take 1 tablet (20 mg total) by mouth daily.   memantine 28 MG Cp24 24 hr capsule Commonly known as: NAMENDA XR Take 1 capsule by mouth daily.   Mineral Oil Oil Place 2 drops into both ears once a week.      Relevant Imaging Results:  Relevant Lab Results:   Additional Information SSN: [id number]              COVID negative on 01/12/20  Vinie Sill, LCSWA

## 2020-01-14 NOTE — Discharge Summary (Signed)
Physician Discharge Summary  Dawn Buchanan [id number] DOB: Sep 15, 1927 DOA: 01/12/2020  PCP: Dawn Manes, MD  Admit date: 01/12/2020 Discharge date: 01/14/2020  Admitted From: Dawn Buchanan memory care unit Disposition: [facility] memory care unit  Recommendations for Outpatient Follow-up:  1. Follow ups as below. 2. Please obtain CBC/BMP/Mag at follow up 3. Please follow up on the following pending results: None  Home Health: None. Equipment/Devices: None  Discharge Condition: Stable CODE STATUS: DNR/DNI  Follow-up Information    Buchanan, Hal, MD. Schedule an appointment as soon as possible for a visit in 1 week(s).   Specialty: Internal Medicine Contact information: [address] [place] [address] [phone]           Hospital Course: 86 year old female with history of advanced dementia, paroxysmal A. fib not on AC, HTN and COVID-19 in 08/2019 presenting with complaints of left-sided abdominal pain and  Dizziness and admitted for A. fib with RVR and acute diverticulitis. Patient was started on IV Cardizem drip and IV Zosyn.   The next day, she converted back to sinus rhythm.  She was transitioned to p.o. Cardizem which was consolidated to Cardizem CD 120 mg daily.   Regards to uncomplicated diverticulitis, she will be discharged on Augmentin for 8 more days.   Discharge Diagnoses:  A. fib with RVR: Converted to sinus rhythm now.  CHA2DS2-VASc score greater than 3.  Echocardiogram with normal EF but G3 DD. -Discharged on p.o. Cardizem CD 120 mg daily -Not on anticoagulation due to risk of fall-patient son in agreement. -Cardiology to arrange outpatient follow-up  Acute uncomplicated diverticulitis-CT abdomen and pelvis revealed areas of inflammatory changes in the distal descending and proximal sigmoid colon without abscess or perforation.  No leukocytosis.  Hemodynamically stable.  Tolerating regular diet. -IV Zosyn 5/25-5/27.  Discharged on  Augmentin for 8 more days.  Acute on chronic diastolic CHF:  No dyspnea or chest pain.  No signs of fluid overload.   Elevated BNP 551 (no prior values). CXR with cardiomegaly and mild interstitial edema.  Echo with EF of 55 to 60% and G3-DD.  Diuresed well with IV Lasix.  No respiratory issues. -No diuretics on discharge per cardiology.  Alzheimer's dementia without behavioral disturbance -Continue home Aricept and memantine  Essential hypertension: Normotensive.  Not on medication at home. -Cardizem as above.  History of COVID-19 infection in 08/2019 -No respiratory issues.  Goal of care discussion: Patient with history of severe Alzheimer's dementia.  Advanced age.  Chronic comorbidities as above.  Discussed about goal of care and CODE STATUS with patient's son, Dawn Buchanan over the phone.  She was admitted as full code. However, Dawn Buchanan tells me she had DNR form at memory care unit. Per Dawn Buchanan, Mrs Reddig always wanted to sleep peacefully when it is time.  -IChanged CODE STATUS to DNR/DNI with [name]'s permission.                Discharge Exam: Vitals:   01/14/20 0755 01/14/20 1126  BP: (!) 149/87 (!) 155/78  Pulse: 75 72  Resp: 19   Temp: 98.1 F (36.7 C) 98.4 F (36.9 C)  SpO2: 99% 99%    GENERAL: No apparent distress.  Nontoxic. HEENT: MMM.  Vision and hearing grossly intact.  NECK: Supple.  No apparent JVD.  RESP:  No IWOB.  Fair aeration bilaterally. CVS:  RRR. Heart sounds normal.  ABD/GI/GU: Bowel sounds present. Soft. Non tender.  MSK/EXT:  Moves extremities. No apparent deformity. No edema.  SKIN: no apparent  skin lesion or wound NEURO: Awake, alert and oriented only to self.  No apparent focal neuro deficit. PSYCH: Calm. Normal affect.  Discharge Instructions  Discharge Instructions    (HEART FAILURE PATIENTS) Call MD:  Anytime you have any of the following symptoms: 1) 3 pound weight gain in 24 hours or 5 pounds in 1 week 2) shortness of breath, with or  without a dry hacking cough 3) swelling in the hands, feet or stomach 4) if you have to sleep on extra pillows at night in order to breathe.   Complete by: As directed    Call MD for:  difficulty breathing, headache or visual disturbances   Complete by: As directed    Call MD for:  extreme fatigue   Complete by: As directed    Call MD for:  persistant dizziness or light-headedness   Complete by: As directed    Diet general   Complete by: As directed      Allergies as of 01/14/2020   No Known Allergies     Medication List    TAKE these medications   amoxicillin-clavulanate 875-125 MG tablet Commonly known as: Augmentin Take 1 tablet by mouth 2 (two) times daily for 8 days.   diltiazem 120 MG 24 hr capsule Commonly known as: CARDIZEM CD Take 1 capsule (120 mg total) by mouth daily.   donepezil 5 MG tablet Commonly known as: ARICEPT Take 5 mg by mouth at bedtime.   furosemide 20 MG tablet Commonly known as: LASIX Take 1 tablet (20 mg total) by mouth daily.   memantine 28 MG Cp24 24 hr capsule Commonly known as: NAMENDA XR Take 1 capsule by mouth daily.   Mineral Oil Oil Place 2 drops into both ears once a week.       Consultations:  Cardiology  Procedures/Studies:  2D Echo on 01/13/2020 1. Left ventricular ejection fraction, by estimation, is 55 to 60%. The  left ventricle has normal function. The left ventricle has no regional  wall motion abnormalities. There is mild asymmetric left ventricular  hypertrophy of the basal-septal segment.  Left ventricular diastolic parameters are consistent with Grade III  diastolic dysfunction (restrictive). Elevated left atrial pressure.  2. Right ventricular systolic function is normal. The right ventricular  size is normal. There is normal pulmonary artery systolic pressure. The  estimated right ventricular systolic pressure is 0000000 mmHg.  3. The mitral valve is degenerative. Mild mitral valve regurgitation. No  evidence  of mitral stenosis.  4. The aortic valve is tricuspid. Aortic valve regurgitation is mild.  Mild to moderate aortic valve sclerosis/calcification is present, without  any evidence of aortic stenosis.  5. The inferior vena cava is normal in size with greater than 50%  respiratory variability, suggesting right atrial pressure of 3 mmHg.    CT ABDOMEN PELVIS W CONTRAST  Result Date: 01/12/2020 CLINICAL DATA:  Left-sided abdominal pain EXAM: CT ABDOMEN AND PELVIS WITH CONTRAST TECHNIQUE: Multidetector CT imaging of the abdomen and pelvis was performed using the standard protocol following bolus administration of intravenous contrast. CONTRAST:  162mL OMNIPAQUE IOHEXOL 350 MG/ML SOLN COMPARISON:  None. FINDINGS: Lower chest: Mild dependent atelectatic changes are noted. Hepatobiliary: Fatty infiltration of the liver is noted. Cyst is noted within the caudate lobe measuring 2 cm. The gallbladder has been surgically removed. Pancreas: Unremarkable. No pancreatic ductal dilatation or surrounding inflammatory changes. Spleen: Normal in size without focal abnormality. Adrenals/Urinary Tract: Adrenal glands are within normal limits. Kidneys demonstrate a normal enhancement pattern. Small  cysts are noted on the left. No renal calculi or obstructive changes are seen. The bladder is partially distended. Stomach/Bowel: Diverticular change of the colon is noted with significant smooth muscle hypertrophy. Areas of inflammatory change are noted in the distal descending/proximal sigmoid colon consistent with diverticulitis. No definitive abscess or perforation is noted. Stomach is within normal limits. The small bowel is well visualized and within normal limits. Vascular/Lymphatic: Aortic atherosclerosis. No enlarged abdominal or pelvic lymph nodes. Reproductive: Status post hysterectomy. No adnexal masses. Other: Small fat containing umbilical hernia is noted. Musculoskeletal: Degenerative changes of lumbar spine are  noted. IMPRESSION: Changes consistent with diverticulitis without abscess or perforation. Hepatic and renal cysts. Fatty liver. Electronically Signed   By: Inez Catalina M.D.   On: 01/12/2020 15:10   DG Chest Portable 1 View  Result Date: 01/12/2020 CLINICAL DATA:  AFib EXAM: PORTABLE CHEST 1 VIEW COMPARISON:  September 14, 2019 FINDINGS: There is unchanged cardiomegaly. Aortic knob calcifications. Mildly increased interstitial markings seen throughout both lungs. Overall shallow degree of aeration with crowding of the bronchovascular structures. No acute osseous abnormality. IMPRESSION: cardiomegaly and mild interstitial edema. Electronically Signed   By: Prudencio Pair M.D.   On: 01/12/2020 13:28   ECHOCARDIOGRAM COMPLETE  Result Date: 01/13/2020    ECHOCARDIOGRAM REPORT   Patient Name:   Dawn Buchanan Date of Exam: 01/13/2020 Medical Rec #:  [id number]    Height:       63.0 in Accession #:    [id number]   Weight:       128.3 lb Date of Birth:  July 03, 1928    BSA:          1.601 m Patient Age:    38 years     BP:           125/82 mmHg Patient Gender: F            HR:           71 bpm. Exam Location:  Inpatient Procedure: 2D Echo, Color Doppler and Cardiac Doppler Indications:    I48.91* Unspecified atrial fibrillation  History:        Patient has prior history of Echocardiogram examinations, most                 recent 05/16/2015. CHF, Arrythmias:Atrial Fibrillation; Risk                 Factors:Hypertension. COVID+ on 09/14/19.  Sonographer:    Raquel Sarna Senior RDCS Referring Phys: [id number] [place]  1. Left ventricular ejection fraction, by estimation, is 55 to 60%. The left ventricle has normal function. The left ventricle has no regional wall motion abnormalities. There is mild asymmetric left ventricular hypertrophy of the basal-septal segment. Left ventricular diastolic parameters are consistent with Grade III diastolic dysfunction (restrictive). Elevated left atrial pressure.  2. Right  ventricular systolic function is normal. The right ventricular size is normal. There is normal pulmonary artery systolic pressure. The estimated right ventricular systolic pressure is 0000000 mmHg.  3. The mitral valve is degenerative. Mild mitral valve regurgitation. No evidence of mitral stenosis.  4. The aortic valve is tricuspid. Aortic valve regurgitation is mild. Mild to moderate aortic valve sclerosis/calcification is present, without any evidence of aortic stenosis.  5. The inferior vena cava is normal in size with greater than 50% respiratory variability, suggesting right atrial pressure of 3 mmHg. FINDINGS  Left Ventricle: Left ventricular ejection fraction, by estimation, is 55 to 60%. The left ventricle has  normal function. The left ventricle has no regional wall motion abnormalities. The left ventricular internal cavity size was normal in size. There is  mild asymmetric left ventricular hypertrophy of the basal-septal segment. Left ventricular diastolic parameters are consistent with Grade III diastolic dysfunction (restrictive). Elevated left atrial pressure. Right Ventricle: The right ventricular size is normal. No increase in right ventricular wall thickness. Right ventricular systolic function is normal. There is normal pulmonary artery systolic pressure. The tricuspid regurgitant velocity is 2.55 m/s, and  with an assumed right atrial pressure of 3 mmHg, the estimated right ventricular systolic pressure is 0000000 mmHg. Left Atrium: Left atrial size was normal in size. Right Atrium: Right atrial size was normal in size. Pericardium: Trivial pericardial effusion is present. Presence of pericardial fat pad. Mitral Valve: The mitral valve is degenerative in appearance. Mild mitral annular calcification. Mild mitral valve regurgitation. No evidence of mitral valve stenosis. Tricuspid Valve: The tricuspid valve is grossly normal. Tricuspid valve regurgitation is mild . No evidence of tricuspid stenosis.  Aortic Valve: The aortic valve is tricuspid. . There is mild thickening and mild calcification of the aortic valve. Aortic valve regurgitation is mild. Aortic regurgitation PHT measures 543 msec. Mild to moderate aortic valve sclerosis/calcification is present, without any evidence of aortic stenosis. There is mild thickening of the aortic valve. There is mild calcification of the aortic valve. Aortic valve peak gradient measures 6.2 mmHg. Pulmonic Valve: The pulmonic valve was grossly normal. Pulmonic valve regurgitation is trivial. No evidence of pulmonic stenosis. Aorta: The aortic root and ascending aorta are structurally normal, with no evidence of dilitation. Venous: The inferior vena cava is normal in size with greater than 50% respiratory variability, suggesting right atrial pressure of 3 mmHg. IAS/Shunts: The atrial septum is grossly normal.  LEFT VENTRICLE PLAX 2D LVIDd:         3.70 cm  Diastology LVIDs:         2.70 cm  LV e' lateral:   4.68 cm/s LV PW:         0.80 cm  LV E/e' lateral: 22.6 LV IVS:        1.20 cm  LV e' medial:    4.12 cm/s LVOT diam:     1.70 cm  LV E/e' medial:  25.7 LV SV:         44 LV SV Index:   27 LVOT Area:     2.27 cm  RIGHT VENTRICLE             IVC RV S prime:     15.10 cm/s  IVC diam: 1.50 cm TAPSE (M-mode): 2.0 cm LEFT ATRIUM             Index       RIGHT ATRIUM           Index LA diam:        4.60 cm 2.87 cm/m  RA Area:     15.70 cm LA Vol (A2C):   27.7 ml 17.30 ml/m RA Volume:   36.20 ml  22.61 ml/m LA Vol (A4C):   45.5 ml 28.42 ml/m LA Biplane Vol: 39.1 ml 24.42 ml/m  AORTIC VALVE AV Area (Vmax): 1.36 cm AV Vmax:        125.00 cm/s AV Peak Grad:   6.2 mmHg LVOT Vmax:      74.80 cm/s LVOT Vmean:     55.900 cm/s LVOT VTI:       0.193 m AI PHT:  543 msec  AORTA Ao Root diam: 2.80 cm Ao Asc diam:  3.40 cm MITRAL VALVE                TRICUSPID VALVE MV Area (PHT): 2.76 cm     TR Peak grad:   26.0 mmHg MV Decel Time: 275 msec     TR Vmax:        255.00 cm/s MV  E velocity: 106.00 cm/s MV A velocity: 31.30 cm/s   SHUNTS MV E/A ratio:  3.39         Systemic VTI:  0.19 m                             Systemic Diam: 1.70 cm Eleonore Chiquito MD Electronically signed by Eleonore Chiquito MD Signature Date/Time: 01/13/2020/2:54:51 PM    Final        The results of significant diagnostics from this hospitalization (including imaging, microbiology, ancillary and laboratory) are listed below for reference.     Microbiology: Recent Results (from the past 240 hour(s))  SARS Coronavirus 2 by RT PCR (hospital order, performed in [facility] hospital lab) Nasopharyngeal Nasopharyngeal Swab     Status: None   Collection Time: 01/12/20  3:54 PM   Specimen: Nasopharyngeal Swab  Result Value Ref Range Status   SARS Coronavirus 2 NEGATIVE NEGATIVE Final    Comment: (NOTE) SARS-CoV-2 target nucleic acids are NOT DETECTED. The SARS-CoV-2 RNA is generally detectable in upper and lower respiratory specimens during the acute phase of infection. The lowest concentration of SARS-CoV-2 viral copies this assay can detect is 250 copies / mL. A negative result does not preclude SARS-CoV-2 infection and should not be used as the sole basis for treatment or other patient management decisions.  A negative result may occur with improper specimen collection / handling, submission of specimen other than nasopharyngeal swab, presence of viral mutation(s) within the areas targeted by this assay, and inadequate number of viral copies (<250 copies / mL). A negative result must be combined with clinical observations, patient history, and epidemiological information. Fact Sheet for Patients:   StrictlyIdeas.no Fact Sheet for Healthcare Providers: BankingDealers.co.za This test is not yet approved or cleared  by the [place] FDA and has been authorized for detection and/or diagnosis of SARS-CoV-2 by FDA under an Emergency Use Authorization  (EUA).  This EUA will remain in effect (meaning this test can be used) for the duration of the COVID-19 declaration under Section 564(b)(1) of the Act, 21 U.S.C. section 360bbb-3(b)(1), unless the authorization is terminated or revoked sooner. Performed at [facility], [place] [address], [place], [place] [address]      Labs: BNP (last 3 results) Recent Labs    01/12/20 1805  BNP 0000000*   Basic Metabolic Panel: Recent Labs  Lab 01/12/20 1300 01/12/20 1805 01/13/20 0411 01/14/20 0259  NA 139  --  138 138  K 3.5  --  4.0 3.5  CL 104  --  104 101  CO2 24  --  23 23  GLUCOSE 103*  --  111* 107*  BUN 8  --  6* 8  CREATININE 0.75  --  0.77 0.88  CALCIUM 8.9  --  8.5* 8.5*  MG  --  1.9  --  1.8  PHOS  --   --   --  3.6   Liver Function Tests: Recent Labs  Lab 01/12/20 1300 01/14/20 0259  AST 15  --  ALT 13  --   ALKPHOS 64  --   BILITOT 1.2  --   PROT 6.9  --   ALBUMIN 3.2* 3.0*   Recent Labs  Lab 01/12/20 1300  LIPASE 29   No results for input(s): AMMONIA in the last 168 hours. CBC: Recent Labs  Lab 01/12/20 1300 01/13/20 0411  WBC 9.7 6.9  NEUTROABS 7.2  --   HGB 14.1 12.7  HCT 44.2 37.9  MCV 90.4 88.8  PLT 292 290   Cardiac Enzymes: No results for input(s): CKTOTAL, CKMB, CKMBINDEX, TROPONINI in the last 168 hours. BNP: Invalid input(s): POCBNP CBG: No results for input(s): GLUCAP in the last 168 hours. D-Dimer No results for input(s): DDIMER in the last 72 hours. Hgb A1c No results for input(s): HGBA1C in the last 72 hours. Lipid Profile No results for input(s): CHOL, HDL, LDLCALC, TRIG, CHOLHDL, LDLDIRECT in the last 72 hours. Thyroid function studies Recent Labs    01/12/20 1805  TSH 2.065   Anemia work up No results for input(s): VITAMINB12, FOLATE, FERRITIN, TIBC, IRON, RETICCTPCT in the last 72 hours. Urinalysis    Component Value Date/Time   COLORURINE YELLOW 01/12/2020 1500   APPEARANCEUR CLEAR 01/12/2020 1500   LABSPEC  1.028 01/12/2020 1500   PHURINE 6.0 01/12/2020 1500   GLUCOSEU NEGATIVE 01/12/2020 1500   HGBUR NEGATIVE 01/12/2020 1500   BILIRUBINUR NEGATIVE 01/12/2020 1500   KETONESUR NEGATIVE 01/12/2020 1500   PROTEINUR NEGATIVE 01/12/2020 1500   NITRITE NEGATIVE 01/12/2020 1500   LEUKOCYTESUR NEGATIVE 01/12/2020 1500   Sepsis Labs Invalid input(s): PROCALCITONIN,  WBC,  LACTICIDVEN   Time coordinating discharge: 40 minutes  SIGNED:  Mercy Riding, MD  [facility] 01/14/2020, 1:56 PM  If 7PM-7AM, please contact night-coverage [email or web address] Password [email or web address]

## 2020-01-14 NOTE — TOC Transition Note (Signed)
Transition of Care [facility]) - CM/SW Discharge Note   Patient Details  Name: Dawn Buchanan MRN: [id number] Date of Birth: March 11, 1928  [facility] CM/SW Contact:  Vinie Sill, [place] Phone Number: 01/14/2020, 4:03 PM   Clinical Narrative:     Patient will DC to: [facility] DC Date:01/14/2020 Family Notified:patients son Transport By: car, patient's son will transport    Per MD patient is ready for discharge. RN, patient, and facility notified of DC. Discharge Summary sent to facility. RN given number for [phone].    Clinical Social Worker signing off. Thurmond Butts, MSW, [place] Clinical Social Worker    Final next level of care: Memory Care Barriers to Discharge: Barriers Resolved   Patient Goals and CMS Choice Patient states their goals for this hospitalization and ongoing recovery are:: return to memory care CMS Medicare.gov Compare Post Acute Care list provided to:: Patient Represenative (must comment)(son) Choice offered to / list presented to : Adult Children  Discharge Placement              Patient chooses bed at: [facility]) Patient to be transferred to facility by: family-son Name of family member notified: patient's son Patient and family notified of of transfer: 01/14/20  Discharge Plan and Services In-house Referral: Clinical Social Work                                   Social Determinants of Health (SDOH) Interventions     Readmission Risk Interventions No flowsheet data found.

## 2020-01-14 NOTE — Evaluation (Signed)
Physical Therapy Evaluation Patient Details Name: Dawn Buchanan MRN: [id number] DOB: 10-10-1927 Today's Date: 01/14/2020   History of Present Illness  Pt is an 86 y/o female admitted secondary to a fib with RVR and diverticulitis. PMH includes dementia, a fib, dCHF, HTN, and COVID 19.   Clinical Impression  Pt admitted secondary to problem above with deficits below. Pt requiring min to min guard A for mobility tasks this session. Pt with dementia at baseline and extremely HOH. PT had to write questions/tasks on whiteboard during session; pt followed written commands well. Per notes, pt from memory care unit. Recommend return at d/c. Will continue to follow acutely to maximize functional mobility independence and safety.     Follow Up Recommendations Supervision/Assistance - 24 hour;Other (comment)(return to memory care unit)    Equipment Recommendations  None recommended by PT    Recommendations for Other Services       Precautions / Restrictions Precautions Precautions: Fall Restrictions Weight Bearing Restrictions: No      Mobility  Bed Mobility Overal bed mobility: Needs Assistance Bed Mobility: Supine to Sit;Sit to Supine     Supine to sit: Min assist Sit to supine: Supervision   General bed mobility comments: Min A for trunk elevation. Required gestures to come to sitting. Supervision to return to supine.   Transfers Overall transfer level: Needs assistance Equipment used: 1 person hand held assist Transfers: Sit to/from Stand Sit to Stand: Min guard         General transfer comment: Min guard for safety and steadying.   Ambulation/Gait Ambulation/Gait assistance: Min guard Gait Distance (Feet): 5 Feet Assistive device: 1 person hand held assist Gait Pattern/deviations: Step-through pattern;Decreased stride length Gait velocity: Decreased    General Gait Details: PT stood in front of pt and had pt hold to PT arms. Pt requiring min guard for safety to take  steps forwards and backwards at EOB.   Stairs            Wheelchair Mobility    Modified Rankin (Stroke Patients Only)       Balance Overall balance assessment: Needs assistance Sitting-balance support: No upper extremity supported;Feet supported Sitting balance-Leahy Scale: Good     Standing balance support: Bilateral upper extremity supported;During functional activity Standing balance-Leahy Scale: Poor Standing balance comment: Reliant on BUE support                              Pertinent Vitals/Pain Pain Assessment: No/denies pain    Home Living Family/patient expects to be discharged to:: Other (Comment)(memory care unit)                 Additional Comments: Per notes, pt from memory care unit.     Prior Function           Comments: Unsure of accuracy as pt reports she was independent. Likely required assist for mobility and ADL tasks.      Hand Dominance        Extremity/Trunk Assessment   Upper Extremity Assessment Upper Extremity Assessment: Defer to OT evaluation    Lower Extremity Assessment Lower Extremity Assessment: Generalized weakness    Cervical / Trunk Assessment Cervical / Trunk Assessment: Normal  Communication   Communication: HOH(extremely HOH; does better having things written)  Cognition Arousal/Alertness: Awake/alert Behavior During Therapy: WFL for tasks assessed/performed Overall Cognitive Status: History of cognitive impairments - at baseline  General Comments: Dementia at baseline       General Comments      Exercises     Assessment/Plan    PT Assessment Patient needs continued PT services  PT Problem List Decreased strength;Decreased balance;Decreased mobility;Decreased cognition;Decreased safety awareness;Decreased knowledge of use of DME       PT Treatment Interventions Gait training;DME instruction;Therapeutic activities;Therapeutic  exercise;Functional mobility training;Balance training;Neuromuscular re-education;Cognitive remediation;Patient/family education    PT Goals (Current goals can be found in the Care Plan section)  Acute Rehab PT Goals Patient Stated Goal: none stated PT Goal Formulation: With patient Time For Goal Achievement: 01/28/20 Potential to Achieve Goals: Good    Frequency Min 2X/week   Barriers to discharge        Co-evaluation               AM-PAC PT "6 Clicks" Mobility  Outcome Measure Help needed turning from your back to your side while in a flat bed without using bedrails?: None Help needed moving from lying on your back to sitting on the side of a flat bed without using bedrails?: A Little Help needed moving to and from a bed to a chair (including a wheelchair)?: A Little Help needed standing up from a chair using your arms (e.g., wheelchair or bedside chair)?: A Little Help needed to walk in hospital room?: A Little Help needed climbing 3-5 steps with a railing? : A Lot 6 Click Score: 18    End of Session Equipment Utilized During Treatment: Gait belt Activity Tolerance: Patient tolerated treatment well Patient left: in bed;with call bell/phone within reach;with bed alarm set Nurse Communication: Mobility status PT Visit Diagnosis: Muscle weakness (generalized) (M62.81);Other abnormalities of gait and mobility (R26.89)    Time: [id number] PT Time Calculation (min) (ACUTE ONLY): 19 min   Charges:   PT Evaluation $PT Eval Moderate Complexity: 1 Mod          Reuel Derby, PT, DPT  Acute Rehabilitation Services  Pager: [phone] Office: [phone]   Dawn Buchanan 01/14/2020, 2:07 PM

## 2020-01-14 NOTE — Progress Notes (Addendum)
Pt. Discharged with son back to memory care center. VSS. Discharge/ medication instruction given to son.    Phoebe Sharps, RN

## 2020-01-14 NOTE — Progress Notes (Signed)
Progress Note  Patient Name: Dawn Buchanan Date of Encounter: 01/14/2020  Primary Cardiologist: New to [facility]  Subjective   Resting comfortably. No complaints. NSR  Inpatient Medications    Scheduled Meds: . diltiazem  30 mg Oral Q8H  . donepezil  5 mg Oral QHS  . enoxaparin (LOVENOX) injection  40 mg Subcutaneous Q24H  . furosemide  40 mg Intravenous Daily  . memantine  28 mg Oral Daily  . sodium chloride flush  3 mL Intravenous Q12H   Continuous Infusions: . diltiazem (CARDIZEM) infusion Stopped (01/13/20 1047)  . piperacillin-tazobactam (ZOSYN)  IV 3.375 g (01/14/20 0518)   PRN Meds: acetaminophen **OR** acetaminophen, albuterol, labetalol, ondansetron **OR** ondansetron (ZOFRAN) IV   Vital Signs    Vitals:   01/13/20 1951 01/13/20 2359 01/14/20 0456 01/14/20 0543  BP: (!) 148/71 (!) 157/82 (!) 159/90   Pulse: 72 65 (!) 116   Resp: 19 17 20    Temp: 97.7 F (36.5 C) 97.8 F (36.6 C) 98.2 F (36.8 C)   TempSrc: Oral Oral Oral   SpO2: 95% 95% 95%   Weight:    55.5 kg    Intake/Output Summary (Last 24 hours) at 01/14/2020 0733 Last data filed at 01/14/2020 0518 Gross per 24 hour  Intake 109.42 ml  Output --  Net 109.42 ml   Filed Weights   01/13/20 0438 01/14/20 0543  Weight: 58.2 kg 55.5 kg    Physical Exam   General: Elderly, NAD Lungs:Clear to ausculation bilaterally. No wheezes, rales, or rhonchi. Breathing is unlabored. Cardiovascular: RRR with S1 S2. No murmurs Extremities: No edema. Radial pulses 2+ bilaterally Neuro: Alert and oriented to self only. No focal deficits. No facial asymmetry. MAE spontaneously. Psych: Responds to questions somewhat appropriately with normal affect.    Labs    Chemistry Recent Labs  Lab 01/12/20 1300 01/13/20 0411 01/14/20 0259  NA 139 138 138  K 3.5 4.0 3.5  CL 104 104 101  CO2 24 23 23   GLUCOSE 103* 111* 107*  BUN 8 6* 8  CREATININE 0.75 0.77 0.88  CALCIUM 8.9 8.5* 8.5*  PROT 6.9  --   --    ALBUMIN 3.2*  --  3.0*  AST 15  --   --   ALT 13  --   --   ALKPHOS 64  --   --   BILITOT 1.2  --   --   GFRNONAA >60 >60 58*  GFRAA >60 >60 >60  ANIONGAP 11 11 14      Hematology Recent Labs  Lab 01/12/20 1300 01/13/20 0411  WBC 9.7 6.9  RBC 4.89 4.27  HGB 14.1 12.7  HCT 44.2 37.9  MCV 90.4 88.8  MCH 28.8 29.7  MCHC 31.9 33.5  RDW 13.2 13.2  PLT 292 290    Cardiac EnzymesNo results for input(s): TROPONINI in the last 168 hours. No results for input(s): TROPIPOC in the last 168 hours.   BNP Recent Labs  Lab 01/12/20 1805  BNP 551.2*     DDimer No results for input(s): DDIMER in the last 168 hours.   Radiology    CT ABDOMEN PELVIS W CONTRAST  Result Date: 01/12/2020 CLINICAL DATA:  Left-sided abdominal pain EXAM: CT ABDOMEN AND PELVIS WITH CONTRAST TECHNIQUE: Multidetector CT imaging of the abdomen and pelvis was performed using the standard protocol following bolus administration of intravenous contrast. CONTRAST:  146mL OMNIPAQUE IOHEXOL 350 MG/ML SOLN COMPARISON:  None. FINDINGS: Lower chest: Mild dependent atelectatic changes are noted.  Hepatobiliary: Fatty infiltration of the liver is noted. Cyst is noted within the caudate lobe measuring 2 cm. The gallbladder has been surgically removed. Pancreas: Unremarkable. No pancreatic ductal dilatation or surrounding inflammatory changes. Spleen: Normal in size without focal abnormality. Adrenals/Urinary Tract: Adrenal glands are within normal limits. Kidneys demonstrate a normal enhancement pattern. Small cysts are noted on the left. No renal calculi or obstructive changes are seen. The bladder is partially distended. Stomach/Bowel: Diverticular change of the colon is noted with significant smooth muscle hypertrophy. Areas of inflammatory change are noted in the distal descending/proximal sigmoid colon consistent with diverticulitis. No definitive abscess or perforation is noted. Stomach is within normal limits. The small bowel  is well visualized and within normal limits. Vascular/Lymphatic: Aortic atherosclerosis. No enlarged abdominal or pelvic lymph nodes. Reproductive: Status post hysterectomy. No adnexal masses. Other: Small fat containing umbilical hernia is noted. Musculoskeletal: Degenerative changes of lumbar spine are noted. IMPRESSION: Changes consistent with diverticulitis without abscess or perforation. Hepatic and renal cysts. Fatty liver. Electronically Signed   By: Inez Catalina M.D.   On: 01/12/2020 15:10   DG Chest Portable 1 View  Result Date: 01/12/2020 CLINICAL DATA:  AFib EXAM: PORTABLE CHEST 1 VIEW COMPARISON:  September 14, 2019 FINDINGS: There is unchanged cardiomegaly. Aortic knob calcifications. Mildly increased interstitial markings seen throughout both lungs. Overall shallow degree of aeration with crowding of the bronchovascular structures. No acute osseous abnormality. IMPRESSION: cardiomegaly and mild interstitial edema. Electronically Signed   By: Prudencio Pair M.D.   On: 01/12/2020 13:28   ECHOCARDIOGRAM COMPLETE  Result Date: 01/13/2020    ECHOCARDIOGRAM REPORT   Patient Name:   Dawn Buchanan Date of Exam: 01/13/2020 Medical Rec #:  [id number]    Height:       63.0 in Accession #:    [id number]   Weight:       128.3 lb Date of Birth:  1928-07-26    BSA:          1.601 m Patient Age:    86 years     BP:           125/82 mmHg Patient Gender: F            HR:           71 bpm. Exam Location:  Inpatient Procedure: 2D Echo, Color Doppler and Cardiac Doppler Indications:    I48.91* Unspecified atrial fibrillation  History:        Patient has prior history of Echocardiogram examinations, most                 recent 05/16/2015. CHF, Arrythmias:Atrial Fibrillation; Risk                 Factors:Hypertension. COVID+ on 09/14/19.  Sonographer:    Raquel Sarna Senior RDCS Referring Phys: [phone] [place]  1. Left ventricular ejection fraction, by estimation, is 55 to 60%. The left ventricle has normal  function. The left ventricle has no regional wall motion abnormalities. There is mild asymmetric left ventricular hypertrophy of the basal-septal segment. Left ventricular diastolic parameters are consistent with Grade III diastolic dysfunction (restrictive). Elevated left atrial pressure.  2. Right ventricular systolic function is normal. The right ventricular size is normal. There is normal pulmonary artery systolic pressure. The estimated right ventricular systolic pressure is 0000000 mmHg.  3. The mitral valve is degenerative. Mild mitral valve regurgitation. No evidence of mitral stenosis.  4. The aortic valve is tricuspid. Aortic valve regurgitation is  mild. Mild to moderate aortic valve sclerosis/calcification is present, without any evidence of aortic stenosis.  5. The inferior vena cava is normal in size with greater than 50% respiratory variability, suggesting right atrial pressure of 3 mmHg. FINDINGS  Left Ventricle: Left ventricular ejection fraction, by estimation, is 55 to 60%. The left ventricle has normal function. The left ventricle has no regional wall motion abnormalities. The left ventricular internal cavity size was normal in size. There is  mild asymmetric left ventricular hypertrophy of the basal-septal segment. Left ventricular diastolic parameters are consistent with Grade III diastolic dysfunction (restrictive). Elevated left atrial pressure. Right Ventricle: The right ventricular size is normal. No increase in right ventricular wall thickness. Right ventricular systolic function is normal. There is normal pulmonary artery systolic pressure. The tricuspid regurgitant velocity is 2.55 m/s, and  with an assumed right atrial pressure of 3 mmHg, the estimated right ventricular systolic pressure is 0000000 mmHg. Left Atrium: Left atrial size was normal in size. Right Atrium: Right atrial size was normal in size. Pericardium: Trivial pericardial effusion is present. Presence of pericardial fat pad.  Mitral Valve: The mitral valve is degenerative in appearance. Mild mitral annular calcification. Mild mitral valve regurgitation. No evidence of mitral valve stenosis. Tricuspid Valve: The tricuspid valve is grossly normal. Tricuspid valve regurgitation is mild . No evidence of tricuspid stenosis. Aortic Valve: The aortic valve is tricuspid. . There is mild thickening and mild calcification of the aortic valve. Aortic valve regurgitation is mild. Aortic regurgitation PHT measures 543 msec. Mild to moderate aortic valve sclerosis/calcification is present, without any evidence of aortic stenosis. There is mild thickening of the aortic valve. There is mild calcification of the aortic valve. Aortic valve peak gradient measures 6.2 mmHg. Pulmonic Valve: The pulmonic valve was grossly normal. Pulmonic valve regurgitation is trivial. No evidence of pulmonic stenosis. Aorta: The aortic root and ascending aorta are structurally normal, with no evidence of dilitation. Venous: The inferior vena cava is normal in size with greater than 50% respiratory variability, suggesting right atrial pressure of 3 mmHg. IAS/Shunts: The atrial septum is grossly normal.  LEFT VENTRICLE PLAX 2D LVIDd:         3.70 cm  Diastology LVIDs:         2.70 cm  LV e' lateral:   4.68 cm/s LV PW:         0.80 cm  LV E/e' lateral: 22.6 LV IVS:        1.20 cm  LV e' medial:    4.12 cm/s LVOT diam:     1.70 cm  LV E/e' medial:  25.7 LV SV:         44 LV SV Index:   27 LVOT Area:     2.27 cm  RIGHT VENTRICLE             IVC RV S prime:     15.10 cm/s  IVC diam: 1.50 cm TAPSE (M-mode): 2.0 cm LEFT ATRIUM             Index       RIGHT ATRIUM           Index LA diam:        4.60 cm 2.87 cm/m  RA Area:     15.70 cm LA Vol (A2C):   27.7 ml 17.30 ml/m RA Volume:   36.20 ml  22.61 ml/m LA Vol (A4C):   45.5 ml 28.42 ml/m LA Biplane Vol: 39.1 ml 24.42 ml/m  AORTIC  VALVE AV Area (Vmax): 1.36 cm AV Vmax:        125.00 cm/s AV Peak Grad:   6.2 mmHg LVOT Vmax:       74.80 cm/s LVOT Vmean:     55.900 cm/s LVOT VTI:       0.193 m AI PHT:         543 msec  AORTA Ao Root diam: 2.80 cm Ao Asc diam:  3.40 cm MITRAL VALVE                TRICUSPID VALVE MV Area (PHT): 2.76 cm     TR Peak grad:   26.0 mmHg MV Decel Time: 275 msec     TR Vmax:        255.00 cm/s MV E velocity: 106.00 cm/s MV A velocity: 31.30 cm/s   SHUNTS MV E/A ratio:  3.39         Systemic VTI:  0.19 m                             Systemic Diam: 1.70 cm Eleonore Chiquito MD Electronically signed by Eleonore Chiquito MD Signature Date/Time: 01/13/2020/2:54:51 PM    Final    Telemetry    01/14/20 NSR - Personally Reviewed  ECG    No new tracing as of 01/14/20 - Personally Reviewed  Cardiac Studies   Echocardiogram 01/13/20:  1. Left ventricular ejection fraction, by estimation, is 55 to 60%. The  left ventricle has normal function. The left ventricle has no regional  wall motion abnormalities. There is mild asymmetric left ventricular  hypertrophy of the basal-septal segment.  Left ventricular diastolic parameters are consistent with Grade III  diastolic dysfunction (restrictive). Elevated left atrial pressure.  2. Right ventricular systolic function is normal. The right ventricular  size is normal. There is normal pulmonary artery systolic pressure. The  estimated right ventricular systolic pressure is 0000000 mmHg.  3. The mitral valve is degenerative. Mild mitral valve regurgitation. No  evidence of mitral stenosis.  4. The aortic valve is tricuspid. Aortic valve regurgitation is mild.  Mild to moderate aortic valve sclerosis/calcification is present, without  any evidence of aortic stenosis.  5. The inferior vena cava is normal in size with greater than 50%  respiratory variability, suggesting right atrial pressure of 3 mmHg.    Echocardiogram 05/16/2015:  Study Conclusions   - Left ventricle: The cavity size was normal. Wall thickness was  increased in a pattern of mild LVH. Systolic  function was normal.  The estimated ejection fraction was in the range of 60% to 65%.  Wall motion was normal; there were no regional wall motion  abnormalities. Features are consistent with a pseudonormal left  ventricular filling pattern, with concomitant abnormal relaxation  and increased filling pressure (grade 2 diastolic dysfunction).  - Aortic valve: Sclerosis without stenosis.  - Mitral valve: There was mild to moderate regurgitation directed  centrally.  - Left atrium: The atrium was moderately dilated.   Patient Profile     86 y.o. female with a hx of paroxysmal atrial fibrillation not on anticoagulation, hypertension, and dementia who is being seen today for the evaluation of presumed atrial fibrillation with RVR at the request of Dr. Tamala Julian.  Assessment & Plan    1.  Paroxysmal atrial fibrillation with RVR: -Presented with recurrent atrial fibrillation with rates in the low 100s to 150s>> however has since converted to NSR>>maintaining with stable rates  -Continue  with oral diltiazem 120mg  p.o. daily -No anticoagulation in the setting of progressive dementia and high risk of falls -CHA2DS2VASc =5 (age, female, CHF, HTN)  2.  Acute on chronic diastolic CHF:  -Echocardiogram 05/15/2015 ordered by her PCP which showed a normal LV function at 60 to 65% with no regional wall motion abnormalities and grade 2 DD, aortic sclerosis without stenosis, mild to moderate mitral regurgitation.  -Repeat echocardiogram with normal EF however shows restrictive G3DD -BNP elevated at 501 with CXR consistent with fluid volume overload -Continue IV Lasix 40 mg daily>>consider transition to PO  -Weight, 128lb>>>122lb  -I&O, net +21mL -Daily weight, strict I&O  3.  Mild to moderate mitral regurgitation: -Found on last echocardiogram from 05/15/2015 with no repeat study since that time  -Echocardiogram with mild MR and no AS however with restrictive G3 DD  4.  Aortic sclerosis  without stenosis: -As above, found on previous echocardiogram>> stable, no change -Presented with dizziness however no syncope -Dizziness could be in setting of #1  5.  Progressive dementia: -Continue Namenda, Aricept -Management per primary team -Oriented to person only   Signed, Kathyrn Drown NP-C [facility] Pager: [phone] 01/14/2020, 7:33 AM     For questions or updates, please contact   Please consult [email or web address] for contact info under Cardiology/STEMI.

## 2020-01-22 DIAGNOSIS — R269 Unspecified abnormalities of gait and mobility: Secondary | ICD-10-CM | POA: Diagnosis not present

## 2020-01-22 DIAGNOSIS — I5032 Chronic diastolic (congestive) heart failure: Secondary | ICD-10-CM | POA: Diagnosis not present

## 2020-01-22 DIAGNOSIS — I480 Paroxysmal atrial fibrillation: Secondary | ICD-10-CM | POA: Diagnosis not present

## 2020-01-22 DIAGNOSIS — G301 Alzheimer's disease with late onset: Secondary | ICD-10-CM | POA: Diagnosis not present

## 2020-01-22 DIAGNOSIS — I10 Essential (primary) hypertension: Secondary | ICD-10-CM | POA: Diagnosis not present

## 2020-02-10 DIAGNOSIS — E785 Hyperlipidemia, unspecified: Secondary | ICD-10-CM | POA: Diagnosis not present

## 2020-02-10 DIAGNOSIS — Z8616 Personal history of COVID-19: Secondary | ICD-10-CM | POA: Diagnosis not present

## 2020-02-10 DIAGNOSIS — I5033 Acute on chronic diastolic (congestive) heart failure: Secondary | ICD-10-CM | POA: Diagnosis not present

## 2020-02-10 DIAGNOSIS — G301 Alzheimer's disease with late onset: Secondary | ICD-10-CM | POA: Diagnosis not present

## 2020-02-10 DIAGNOSIS — Z9181 History of falling: Secondary | ICD-10-CM | POA: Diagnosis not present

## 2020-02-10 DIAGNOSIS — I110 Hypertensive heart disease with heart failure: Secondary | ICD-10-CM | POA: Diagnosis not present

## 2020-02-10 DIAGNOSIS — F0280 Dementia in other diseases classified elsewhere without behavioral disturbance: Secondary | ICD-10-CM | POA: Diagnosis not present

## 2020-02-10 DIAGNOSIS — I872 Venous insufficiency (chronic) (peripheral): Secondary | ICD-10-CM | POA: Diagnosis not present

## 2020-02-10 DIAGNOSIS — I480 Paroxysmal atrial fibrillation: Secondary | ICD-10-CM | POA: Diagnosis not present

## 2020-02-10 DIAGNOSIS — K5792 Diverticulitis of intestine, part unspecified, without perforation or abscess without bleeding: Secondary | ICD-10-CM | POA: Diagnosis not present

## 2020-02-15 DIAGNOSIS — I480 Paroxysmal atrial fibrillation: Secondary | ICD-10-CM | POA: Diagnosis not present

## 2020-02-15 DIAGNOSIS — G301 Alzheimer's disease with late onset: Secondary | ICD-10-CM | POA: Diagnosis not present

## 2020-02-15 DIAGNOSIS — I5033 Acute on chronic diastolic (congestive) heart failure: Secondary | ICD-10-CM | POA: Diagnosis not present

## 2020-02-15 DIAGNOSIS — K5792 Diverticulitis of intestine, part unspecified, without perforation or abscess without bleeding: Secondary | ICD-10-CM | POA: Diagnosis not present

## 2020-02-15 DIAGNOSIS — F0280 Dementia in other diseases classified elsewhere without behavioral disturbance: Secondary | ICD-10-CM | POA: Diagnosis not present

## 2020-02-15 DIAGNOSIS — I110 Hypertensive heart disease with heart failure: Secondary | ICD-10-CM | POA: Diagnosis not present

## 2020-02-18 DIAGNOSIS — K5792 Diverticulitis of intestine, part unspecified, without perforation or abscess without bleeding: Secondary | ICD-10-CM | POA: Diagnosis not present

## 2020-02-18 DIAGNOSIS — I480 Paroxysmal atrial fibrillation: Secondary | ICD-10-CM | POA: Diagnosis not present

## 2020-02-18 DIAGNOSIS — F0280 Dementia in other diseases classified elsewhere without behavioral disturbance: Secondary | ICD-10-CM | POA: Diagnosis not present

## 2020-02-18 DIAGNOSIS — I5033 Acute on chronic diastolic (congestive) heart failure: Secondary | ICD-10-CM | POA: Diagnosis not present

## 2020-02-18 DIAGNOSIS — I110 Hypertensive heart disease with heart failure: Secondary | ICD-10-CM | POA: Diagnosis not present

## 2020-02-18 DIAGNOSIS — G301 Alzheimer's disease with late onset: Secondary | ICD-10-CM | POA: Diagnosis not present

## 2020-02-23 DIAGNOSIS — I110 Hypertensive heart disease with heart failure: Secondary | ICD-10-CM | POA: Diagnosis not present

## 2020-02-23 DIAGNOSIS — I480 Paroxysmal atrial fibrillation: Secondary | ICD-10-CM | POA: Diagnosis not present

## 2020-02-23 DIAGNOSIS — I5033 Acute on chronic diastolic (congestive) heart failure: Secondary | ICD-10-CM | POA: Diagnosis not present

## 2020-02-23 DIAGNOSIS — K5792 Diverticulitis of intestine, part unspecified, without perforation or abscess without bleeding: Secondary | ICD-10-CM | POA: Diagnosis not present

## 2020-02-23 DIAGNOSIS — F0280 Dementia in other diseases classified elsewhere without behavioral disturbance: Secondary | ICD-10-CM | POA: Diagnosis not present

## 2020-02-23 DIAGNOSIS — G301 Alzheimer's disease with late onset: Secondary | ICD-10-CM | POA: Diagnosis not present

## 2020-02-24 DIAGNOSIS — I110 Hypertensive heart disease with heart failure: Secondary | ICD-10-CM | POA: Diagnosis not present

## 2020-02-24 DIAGNOSIS — I480 Paroxysmal atrial fibrillation: Secondary | ICD-10-CM | POA: Diagnosis not present

## 2020-02-24 DIAGNOSIS — K5792 Diverticulitis of intestine, part unspecified, without perforation or abscess without bleeding: Secondary | ICD-10-CM | POA: Diagnosis not present

## 2020-02-24 DIAGNOSIS — G301 Alzheimer's disease with late onset: Secondary | ICD-10-CM | POA: Diagnosis not present

## 2020-02-24 DIAGNOSIS — I5033 Acute on chronic diastolic (congestive) heart failure: Secondary | ICD-10-CM | POA: Diagnosis not present

## 2020-02-24 DIAGNOSIS — F0280 Dementia in other diseases classified elsewhere without behavioral disturbance: Secondary | ICD-10-CM | POA: Diagnosis not present

## 2020-02-29 DIAGNOSIS — K5792 Diverticulitis of intestine, part unspecified, without perforation or abscess without bleeding: Secondary | ICD-10-CM | POA: Diagnosis not present

## 2020-02-29 DIAGNOSIS — I480 Paroxysmal atrial fibrillation: Secondary | ICD-10-CM | POA: Diagnosis not present

## 2020-02-29 DIAGNOSIS — I110 Hypertensive heart disease with heart failure: Secondary | ICD-10-CM | POA: Diagnosis not present

## 2020-02-29 DIAGNOSIS — I5033 Acute on chronic diastolic (congestive) heart failure: Secondary | ICD-10-CM | POA: Diagnosis not present

## 2020-02-29 DIAGNOSIS — G301 Alzheimer's disease with late onset: Secondary | ICD-10-CM | POA: Diagnosis not present

## 2020-02-29 DIAGNOSIS — F0280 Dementia in other diseases classified elsewhere without behavioral disturbance: Secondary | ICD-10-CM | POA: Diagnosis not present

## 2020-03-01 DIAGNOSIS — F0280 Dementia in other diseases classified elsewhere without behavioral disturbance: Secondary | ICD-10-CM | POA: Diagnosis not present

## 2020-03-01 DIAGNOSIS — K5792 Diverticulitis of intestine, part unspecified, without perforation or abscess without bleeding: Secondary | ICD-10-CM | POA: Diagnosis not present

## 2020-03-01 DIAGNOSIS — I5033 Acute on chronic diastolic (congestive) heart failure: Secondary | ICD-10-CM | POA: Diagnosis not present

## 2020-03-01 DIAGNOSIS — G301 Alzheimer's disease with late onset: Secondary | ICD-10-CM | POA: Diagnosis not present

## 2020-03-01 DIAGNOSIS — I110 Hypertensive heart disease with heart failure: Secondary | ICD-10-CM | POA: Diagnosis not present

## 2020-03-01 DIAGNOSIS — I480 Paroxysmal atrial fibrillation: Secondary | ICD-10-CM | POA: Diagnosis not present

## 2020-04-17 ENCOUNTER — Emergency Department (HOSPITAL_COMMUNITY)
Admission: EM | Admit: 2020-04-17 | Discharge: 2020-04-18 | Disposition: A | Discharge status: Home / Self Care | Payer: Medicare Other | Attending: Emergency Medicine | Admitting: Emergency Medicine

## 2020-04-17 ENCOUNTER — Encounter (HOSPITAL_COMMUNITY): Payer: Self-pay

## 2020-04-17 ENCOUNTER — Emergency Department (HOSPITAL_COMMUNITY): Payer: Medicare Other

## 2020-04-17 DIAGNOSIS — S22080A Wedge compression fracture of T11-T12 vertebra, initial encounter for closed fracture: Secondary | ICD-10-CM | POA: Insufficient documentation

## 2020-04-17 DIAGNOSIS — Y999 Unspecified external cause status: Secondary | ICD-10-CM | POA: Diagnosis not present

## 2020-04-17 DIAGNOSIS — W19XXXA Unspecified fall, initial encounter: Secondary | ICD-10-CM | POA: Diagnosis not present

## 2020-04-17 DIAGNOSIS — M546 Pain in thoracic spine: Secondary | ICD-10-CM | POA: Diagnosis not present

## 2020-04-17 DIAGNOSIS — Y929 Unspecified place or not applicable: Secondary | ICD-10-CM | POA: Diagnosis not present

## 2020-04-17 DIAGNOSIS — S3993XA Unspecified injury of pelvis, initial encounter: Secondary | ICD-10-CM | POA: Diagnosis not present

## 2020-04-17 DIAGNOSIS — I10 Essential (primary) hypertension: Secondary | ICD-10-CM | POA: Insufficient documentation

## 2020-04-17 DIAGNOSIS — Y939 Activity, unspecified: Secondary | ICD-10-CM | POA: Insufficient documentation

## 2020-04-17 DIAGNOSIS — Z79899 Other long term (current) drug therapy: Secondary | ICD-10-CM | POA: Diagnosis not present

## 2020-04-17 DIAGNOSIS — S24104A Unspecified injury at T11-T12 level of thoracic spinal cord, initial encounter: Secondary | ICD-10-CM | POA: Diagnosis present

## 2020-04-17 DIAGNOSIS — S299XXA Unspecified injury of thorax, initial encounter: Secondary | ICD-10-CM | POA: Diagnosis not present

## 2020-04-17 DIAGNOSIS — F0390 Unspecified dementia without behavioral disturbance: Secondary | ICD-10-CM | POA: Insufficient documentation

## 2020-04-17 DIAGNOSIS — I5032 Chronic diastolic (congestive) heart failure: Secondary | ICD-10-CM | POA: Insufficient documentation

## 2020-04-17 DIAGNOSIS — S0083XA Contusion of other part of head, initial encounter: Secondary | ICD-10-CM | POA: Diagnosis not present

## 2020-04-17 DIAGNOSIS — M545 Low back pain: Secondary | ICD-10-CM | POA: Diagnosis not present

## 2020-04-17 DIAGNOSIS — S0990XA Unspecified injury of head, initial encounter: Secondary | ICD-10-CM | POA: Diagnosis not present

## 2020-04-17 DIAGNOSIS — S199XXA Unspecified injury of neck, initial encounter: Secondary | ICD-10-CM | POA: Diagnosis not present

## 2020-04-17 DIAGNOSIS — R52 Pain, unspecified: Secondary | ICD-10-CM | POA: Diagnosis not present

## 2020-04-17 NOTE — ED Triage Notes (Signed)
Pt comes via [facility] from [facility] [employer], pt had unwitnessed fall, possible hematoma to head, c/o of possible back pain. Pt has dementia and very [facility]

## 2020-04-17 NOTE — ED Provider Notes (Signed)
[facility] EMERGENCY DEPARTMENT Provider Note   CSN: [id number] Arrival date & time: 04/17/20  2338     History Chief Complaint  Patient presents with  . Fall    Dawn Buchanan is a 86 y.o. female.  Level 5 caveat for dementia.  Patient from facility after unwitnessed fall.  She was found wedged between a doorway and a chest of drawers.  She was found to have a hematoma to the right side of her head.  She is complaining of back pain on EMS arrival but denies this now.  She is at her baseline mental status per the son at bedside.  Unclear if she lost consciousness.  She does not take any blood thinners but does have a history of atrial fibrillation.  Denies any neck or back pain for me.  No chest pain or shortness of breath.  No abdominal pain.  No nausea or vomiting. Able to move hips without difficulty.  The history is provided by the patient, the EMS personnel and a relative. The history is limited by the condition of the patient.  Fall       Past Medical History:  Diagnosis Date  . Dementia ([place])   . Hypertension     Patient Active Problem List   Diagnosis Date Noted  . Diverticulitis 01/12/2020  . AF (paroxysmal atrial fibrillation) ([place]) 01/12/2020  . Dementia without behavioral disturbance ([place]) 01/12/2020  . Chronic diastolic CHF (congestive heart failure) ([place]) 01/12/2020    History reviewed. No pertinent surgical history.   OB History   No obstetric history on file.     No family history on file.  Social History   Tobacco Use  . Smoking status: Never Smoker  . Smokeless tobacco: Never Used  Substance Use Topics  . Alcohol use: Not on file  . Drug use: Not on file    Home Medications Prior to Admission medications   Medication Sig Start Date End Date Taking? Authorizing Provider  diltiazem (CARDIZEM CD) 120 MG 24 hr capsule Take 1 capsule (120 mg total) by mouth daily. 01/14/20   Mercy Riding, MD  donepezil (ARICEPT) 5 MG tablet  Take 5 mg by mouth at bedtime.     [provider]  memantine (NAMENDA XR) 28 MG CP24 24 hr capsule Take 1 capsule by mouth daily.    [provider]  Mineral Oil OIL Place 2 drops into both ears once a week.    [provider]    Allergies    Patient has no known allergies.  Review of Systems   Review of Systems  Unable to perform ROS: Dementia    Physical Exam Updated Vital Signs BP (!) 165/110   Pulse 100   Temp 97.8 F (36.6 C) (Oral)   Resp 12   SpO2 96%   Physical Exam Vitals and nursing note reviewed.  Constitutional:      General: She is not in acute distress.    Appearance: She is well-developed.  HENT:     Head: Normocephalic.     Comments: Hematoma R forehead    Mouth/Throat:     Pharynx: No oropharyngeal exudate.  Eyes:     Conjunctiva/sclera: Conjunctivae normal.     Pupils: Pupils are equal, round, and reactive to light.  Neck:     Comments: C-collar in place, no step-offs Cardiovascular:     Rate and Rhythm: Normal rate. Rhythm irregular.     Heart sounds: Normal heart sounds. No murmur  heard.   Pulmonary:     Effort: Pulmonary effort is normal. No respiratory distress.     Breath sounds: Normal breath sounds.  Chest:     Chest wall: No tenderness.  Abdominal:     Palpations: Abdomen is soft.     Tenderness: There is no abdominal tenderness. There is no guarding or rebound.  Musculoskeletal:        General: No tenderness. Normal range of motion.     Cervical back: Normal range of motion and neck supple.     Comments: Pelvis is stable.  Full range of motion without difficulty.  No T or L-spine tenderness  Skin:    General: Skin is warm.     Findings: No erythema or rash.  Neurological:     Mental Status: She is alert.     Cranial Nerves: No cranial nerve deficit.     Motor: No abnormal muscle tone.     Coordination: Coordination normal.     Comments: Oriented to person only which appears to be her baseline.  Moves  all extremities, 5/5 strength throughout, no facial droop.  Psychiatric:        Behavior: Behavior normal.     ED Results / Procedures / Treatments   Labs (all labs ordered are listed, but only abnormal results are displayed) Labs Reviewed  CBC WITH DIFFERENTIAL/PLATELET - Abnormal; Notable for the following components:      Result Value   Abs Immature Granulocytes 0.13 (*)    All other components within normal limits  BASIC METABOLIC PANEL - Abnormal; Notable for the following components:   Glucose, Bld 112 (*)    Calcium 8.7 (*)    All other components within normal limits  URINE CULTURE  URINALYSIS, ROUTINE W REFLEX MICROSCOPIC    EKG EKG Interpretation  Date/Time:  Sunday April 17 2020 23:51:14 EDT Ventricular Rate:  75 PR Interval:    QRS Duration: 91 QT Interval:  385 QTC Calculation: 430 R Axis:   104 Text Interpretation: Atrial fibrillation Right axis deviation Borderline repolarization abnormality Rate slower Confirmed by Ezequiel Essex [id number]) on 04/17/2020 11:56:09 PM   Radiology DG Thoracic Spine 2 View  Result Date: 04/18/2020 CLINICAL DATA:  Fall, back pain EXAM: THORACIC SPINE 2 VIEWS COMPARISON:  None. FINDINGS: There is question of a slight superior compression deformity of the T11 vertebral body with less than 25% loss in height. No other definite displaced fracture is identified. There is diffuse osteopenia. Scattered atherosclerosis is noted. IMPRESSION: Age indeterminate slight superior compression deformity of the T11 vertebral body with less than 25% loss in vertebral body height. Electronically Signed   By: Prudencio Pair M.D.   On: 04/18/2020 01:27   DG Lumbar Spine Complete  Result Date: 04/18/2020 CLINICAL DATA:  Fall and back pain EXAM: LUMBAR SPINE - COMPLETE 4+ VIEW COMPARISON:  None. FINDINGS: There is a mild rightward curvature of the lumbar spine. No definite fracture is noted. There is a minimal anterolisthesis of L4 on L5. Degenerative  changes seen in the lower lumbar spine with disc height loss and facet arthrosis. Scattered vascular calcifications are noted. IMPRESSION: No definite acute fracture or malalignment. Electronically Signed   By: Prudencio Pair M.D.   On: 04/18/2020 01:35   CT Head Wo Contrast  Result Date: 04/18/2020 CLINICAL DATA:  Un witnessed fall, dementia EXAM: CT HEAD WITHOUT CONTRAST CT CERVICAL SPINE WITHOUT CONTRAST TECHNIQUE: Multidetector CT imaging of the head and cervical spine was performed following the standard protocol  without intravenous contrast. Multiplanar CT image reconstructions of the cervical spine were also generated. COMPARISON:  04/24/2014 FINDINGS: CT HEAD FINDINGS Brain: No acute infarct or hemorrhage. Hypodensities in the periventricular white matter consistent with chronic small vessel ischemic changes. There is diffuse cortical atrophy. Lateral ventricles and midline structures are unremarkable. No acute extra-axial fluid collections. No mass effect. Vascular: No hyperdense vessel or unexpected calcification. Skull: Normal. Negative for fracture or focal lesion. Sinuses/Orbits: No acute finding. Other: None. CT CERVICAL SPINE FINDINGS Alignment: Alignment is anatomic. Skull base and vertebrae: No acute displaced fracture. Soft tissues and spinal canal: No prevertebral fluid or swelling. No visible canal hematoma. Disc levels: Mild spondylosis at C5-6 and C6-7. Multilevel facet hypertrophy greatest at C3-4 and C4-5. Right predominant neural foraminal encroachment is seen at C4-5 and C5-6. Upper chest: Airway is patent.  Lung apices are clear. Other: Reconstructed images demonstrate no additional findings. IMPRESSION: 1. No acute intracranial process. 2. No acute cervical spine fracture. Multilevel cervical degenerative changes. Electronically Signed   By: Randa Ngo M.D.   On: 04/18/2020 00:40   CT Cervical Spine Wo Contrast  Result Date: 04/18/2020 CLINICAL DATA:  Un witnessed fall, dementia  EXAM: CT HEAD WITHOUT CONTRAST CT CERVICAL SPINE WITHOUT CONTRAST TECHNIQUE: Multidetector CT imaging of the head and cervical spine was performed following the standard protocol without intravenous contrast. Multiplanar CT image reconstructions of the cervical spine were also generated. COMPARISON:  04/24/2014 FINDINGS: CT HEAD FINDINGS Brain: No acute infarct or hemorrhage. Hypodensities in the periventricular white matter consistent with chronic small vessel ischemic changes. There is diffuse cortical atrophy. Lateral ventricles and midline structures are unremarkable. No acute extra-axial fluid collections. No mass effect. Vascular: No hyperdense vessel or unexpected calcification. Skull: Normal. Negative for fracture or focal lesion. Sinuses/Orbits: No acute finding. Other: None. CT CERVICAL SPINE FINDINGS Alignment: Alignment is anatomic. Skull base and vertebrae: No acute displaced fracture. Soft tissues and spinal canal: No prevertebral fluid or swelling. No visible canal hematoma. Disc levels: Mild spondylosis at C5-6 and C6-7. Multilevel facet hypertrophy greatest at C3-4 and C4-5. Right predominant neural foraminal encroachment is seen at C4-5 and C5-6. Upper chest: Airway is patent.  Lung apices are clear. Other: Reconstructed images demonstrate no additional findings. IMPRESSION: 1. No acute intracranial process. 2. No acute cervical spine fracture. Multilevel cervical degenerative changes. Electronically Signed   By: Randa Ngo M.D.   On: 04/18/2020 00:40   DG Pelvis Portable  Result Date: 04/18/2020 CLINICAL DATA:  [place] EXAM: PORTABLE PELVIS 1-2 VIEWS COMPARISON:  01/12/2020 FINDINGS: Single frontal view of the pelvis was obtained, with the patient rotated toward the right. No acute displaced fracture. The hips are well aligned. Soft tissues are grossly normal. IMPRESSION: 1. No acute pelvic fracture. Electronically Signed   By: Randa Ngo M.D.   On: 04/18/2020 00:08   DG Chest Portable  1 View  Result Date: 04/18/2020 CLINICAL DATA:  [place] EXAM: PORTABLE CHEST 1 VIEW COMPARISON:  01/12/2020 FINDINGS: Single frontal view of the chest demonstrates a stable cardiac silhouette. No airspace disease, effusion, or pneumothorax. No acute bony abnormalities. IMPRESSION: 1. No acute intrathoracic process. Electronically Signed   By: Randa Ngo M.D.   On: 04/18/2020 00:06    Procedures Procedures (including critical care time)  Medications Ordered in ED Medications - No data to display  ED Course  I have reviewed the triage vital signs and the nursing notes.  Pertinent labs & imaging results that were available during my care of  the patient were reviewed by me and considered in my medical decision making (see chart for details).    MDM Rules/Calculators/A&P                         Patient from nursing home after unwitnessed fall.  Initially complained of back pain for EMS but denies this now.  Hematoma to right forehead.  No blood thinner use. She yells when anything is palpated.   CT head and C spine negative.  No tenderness to palpation of back but son concerned that she is having pain. Will obtain xrays.  Patient found to have age-indeterminate T11 compression fracture with mild height loss of 25%.  No retropulsion. She will be fitted with TLSO brace. She remains in A. fib but rate is controlled.  She is not anticoagulated due to age and fall risk.  Urinalysis negative for infection.  Patient fitted with TLSO brace.  Discussed with son at bedside. Patient able to ambulate.  Referral given to PCP as well as neurosurgery regarding her T11 compression fracture. Return precautions discussed. Final Clinical Impression(s) / ED Diagnoses Final diagnoses:  Fall, initial encounter  Compression fracture of T11 vertebra, initial encounter [facility])    Rx / DC Orders ED Discharge Orders    None       Pearlene Teat, Annie Main, MD 04/18/20 [phone]

## 2020-04-18 ENCOUNTER — Emergency Department (HOSPITAL_COMMUNITY): Payer: Medicare Other

## 2020-04-18 DIAGNOSIS — M546 Pain in thoracic spine: Secondary | ICD-10-CM | POA: Diagnosis not present

## 2020-04-18 DIAGNOSIS — S22080A Wedge compression fracture of T11-T12 vertebra, initial encounter for closed fracture: Secondary | ICD-10-CM | POA: Diagnosis not present

## 2020-04-18 DIAGNOSIS — M545 Low back pain: Secondary | ICD-10-CM | POA: Diagnosis not present

## 2020-04-18 DIAGNOSIS — W19XXXA Unspecified fall, initial encounter: Secondary | ICD-10-CM | POA: Diagnosis not present

## 2020-04-18 DIAGNOSIS — S3993XA Unspecified injury of pelvis, initial encounter: Secondary | ICD-10-CM | POA: Diagnosis not present

## 2020-04-18 DIAGNOSIS — M2550 Pain in unspecified joint: Secondary | ICD-10-CM | POA: Diagnosis not present

## 2020-04-18 DIAGNOSIS — S299XXA Unspecified injury of thorax, initial encounter: Secondary | ICD-10-CM | POA: Diagnosis not present

## 2020-04-18 DIAGNOSIS — S199XXA Unspecified injury of neck, initial encounter: Secondary | ICD-10-CM | POA: Diagnosis not present

## 2020-04-18 DIAGNOSIS — I10 Essential (primary) hypertension: Secondary | ICD-10-CM | POA: Diagnosis not present

## 2020-04-18 DIAGNOSIS — S0990XA Unspecified injury of head, initial encounter: Secondary | ICD-10-CM | POA: Diagnosis not present

## 2020-04-18 DIAGNOSIS — Z7401 Bed confinement status: Secondary | ICD-10-CM | POA: Diagnosis not present

## 2020-04-18 LAB — BASIC METABOLIC PANEL
Anion gap: 10 (ref 5–15)
BUN: 12 mg/dL (ref 8–23)
CO2: 23 mmol/L (ref 22–32)
Calcium: 8.7 mg/dL — ABNORMAL LOW (ref 8.9–10.3)
Chloride: 102 mmol/L (ref 98–111)
Creatinine, Ser: 0.78 mg/dL (ref 0.44–1.00)
GFR calc Af Amer: >60 mL/min (ref >60)
GFR calc non Af Amer: >60 mL/min (ref >60)
Glucose, Bld: 112 mg/dL — ABNORMAL HIGH (ref 70–99)
Potassium: 3.8 mmol/L (ref 3.5–5.1)
Sodium: 135 mmol/L (ref 135–145)

## 2020-04-18 LAB — CBC WITH DIFFERENTIAL/PLATELET
Abs Immature Granulocytes: 0.13 10*3/uL — ABNORMAL HIGH (ref 0.00–0.07)
Basophils Absolute: 0.1 10*3/uL (ref 0.0–0.1)
Basophils Relative: 1 %
Eosinophils Absolute: 0.2 10*3/uL (ref 0.0–0.5)
Eosinophils Relative: 3 %
HCT: 43.8 % (ref 36.0–46.0)
Hemoglobin: 13.9 g/dL (ref 12.0–15.0)
Immature Granulocytes: 2 %
Lymphocytes Relative: 14 %
Lymphs Abs: 1.1 10*3/uL (ref 0.7–4.0)
MCH: 28.2 pg (ref 26.0–34.0)
MCHC: 31.7 g/dL (ref 30.0–36.0)
MCV: 88.8 fL (ref 80.0–100.0)
Monocytes Absolute: 0.7 10*3/uL (ref 0.1–1.0)
Monocytes Relative: 9 %
Neutro Abs: 5.7 10*3/uL (ref 1.7–7.7)
Neutrophils Relative %: 71 %
Platelets: 274 10*3/uL (ref 150–400)
RBC: 4.93 MIL/uL (ref 3.87–5.11)
RDW: 15.1 % (ref 11.5–15.5)
WBC: 7.9 10*3/uL (ref 4.0–10.5)
nRBC: 0 % (ref 0.0–0.2)

## 2020-04-18 LAB — URINALYSIS, ROUTINE W REFLEX MICROSCOPIC
Bilirubin Urine: NEGATIVE
Glucose, UA: NEGATIVE mg/dL
Hgb urine dipstick: NEGATIVE
Ketones, ur: NEGATIVE mg/dL
Leukocytes,Ua: NEGATIVE
Nitrite: NEGATIVE
Protein, ur: NEGATIVE mg/dL
Specific Gravity, Urine: 1.009 (ref 1.005–1.030)
pH: 7 (ref 5.0–8.0)

## 2020-04-18 NOTE — Progress Notes (Signed)
Orthopedic Tech Progress Note Patient Details:  Simranjit [facility] Nov 07, 1927 [id number]  Patient ID: Dawn Buchanan, female   DOB: [birth year], 86 y.o.   MRN: [id number] I applied TLSO  Karolee Stamps 04/18/2020, 2:40 AM

## 2020-04-18 NOTE — ED Notes (Signed)
Pt up to bedside commode

## 2020-04-18 NOTE — Discharge Instructions (Signed)
Wear the brace as prescribed and follow-up with your primary doctor as well as the spine doctor. No evidence of UTI today.  Return to the ED with new or worsening symptoms including chest pain, shortness of breath, not acting like herself or any other concerns.

## 2020-04-18 NOTE — ED Notes (Signed)
Pt son updated.

## 2020-04-19 LAB — URINE CULTURE: Culture: >=100000 — AB

## 2020-04-20 ENCOUNTER — Telehealth: Payer: Self-pay | Admitting: *Deleted

## 2020-04-20 NOTE — Telephone Encounter (Signed)
Post ED Visit - Positive Culture Follow-up  Culture report reviewed by antimicrobial stewardship pharmacist: [place] Team []  Elenor Quinones, Pharm.D. []  Heide Guile, Pharm.D., BCPS AQ-ID []  Parks Neptune, Pharm.D., BCPS []  Alycia Rossetti, Pharm.D., BCPS []  [place], [place].D., BCPS, AAHIVP []  Legrand Como, Pharm.D., BCPS, AAHIVP []  Salome Arnt, PharmD, BCPS []  Johnnette Gourd, PharmD, BCPS []  Hughes Better, PharmD, BCPS []  Leeroy Cha, PharmD []  Laqueta Linden, PharmD, BCPS []  Albertina Parr, PharmD  [place] Team []  Leodis Sias, PharmD []  Lindell Spar, PharmD []  Royetta Asal, PharmD []  Graylin Shiver, Rph []  Rema Fendt) Glennon Mac, PharmD []  Arlyn Dunning, PharmD []  Netta Cedars, PharmD []  Dia Sitter, PharmD []  Leone Haven, PharmD []  Gretta Arab, PharmD []  Theodis Shove, PharmD []  Peggyann Juba, PharmD []  Reuel Boom, PharmD   Positive urine culture, Results faxed to Durenda Age [phone] Ardeen Fillers 04/20/2020, 9:57 AM

## 2020-04-25 DIAGNOSIS — I10 Essential (primary) hypertension: Secondary | ICD-10-CM | POA: Diagnosis not present

## 2020-04-25 DIAGNOSIS — S22088A Other fracture of T11-T12 vertebra, initial encounter for closed fracture: Secondary | ICD-10-CM | POA: Diagnosis not present

## 2020-04-25 DIAGNOSIS — G301 Alzheimer's disease with late onset: Secondary | ICD-10-CM | POA: Diagnosis not present

## 2020-04-29 ENCOUNTER — Encounter (HOSPITAL_COMMUNITY): Payer: Self-pay | Admitting: Emergency Medicine

## 2020-04-29 ENCOUNTER — Emergency Department (HOSPITAL_COMMUNITY)
Admission: EM | Admit: 2020-04-29 | Discharge: 2020-04-29 | Disposition: A | Discharge status: Home / Self Care | Payer: Medicare Other | Attending: Emergency Medicine | Admitting: Emergency Medicine

## 2020-04-29 ENCOUNTER — Other Ambulatory Visit: Payer: Self-pay

## 2020-04-29 ENCOUNTER — Emergency Department (HOSPITAL_COMMUNITY): Payer: Medicare Other

## 2020-04-29 DIAGNOSIS — I10 Essential (primary) hypertension: Secondary | ICD-10-CM | POA: Diagnosis not present

## 2020-04-29 DIAGNOSIS — I7090 Unspecified atherosclerosis: Secondary | ICD-10-CM | POA: Diagnosis not present

## 2020-04-29 DIAGNOSIS — S0083XA Contusion of other part of head, initial encounter: Secondary | ICD-10-CM

## 2020-04-29 DIAGNOSIS — R1111 Vomiting without nausea: Secondary | ICD-10-CM | POA: Diagnosis not present

## 2020-04-29 DIAGNOSIS — I5032 Chronic diastolic (congestive) heart failure: Secondary | ICD-10-CM | POA: Diagnosis not present

## 2020-04-29 DIAGNOSIS — F0390 Unspecified dementia without behavioral disturbance: Secondary | ICD-10-CM | POA: Insufficient documentation

## 2020-04-29 DIAGNOSIS — Z743 Need for continuous supervision: Secondary | ICD-10-CM | POA: Diagnosis not present

## 2020-04-29 DIAGNOSIS — W19XXXA Unspecified fall, initial encounter: Secondary | ICD-10-CM | POA: Insufficient documentation

## 2020-04-29 DIAGNOSIS — I110 Hypertensive heart disease with heart failure: Secondary | ICD-10-CM | POA: Insufficient documentation

## 2020-04-29 DIAGNOSIS — R52 Pain, unspecified: Secondary | ICD-10-CM | POA: Diagnosis not present

## 2020-04-29 DIAGNOSIS — M5489 Other dorsalgia: Secondary | ICD-10-CM | POA: Diagnosis not present

## 2020-04-29 DIAGNOSIS — Y939 Activity, unspecified: Secondary | ICD-10-CM | POA: Diagnosis not present

## 2020-04-29 DIAGNOSIS — Y929 Unspecified place or not applicable: Secondary | ICD-10-CM | POA: Diagnosis not present

## 2020-04-29 DIAGNOSIS — Z79899 Other long term (current) drug therapy: Secondary | ICD-10-CM | POA: Diagnosis not present

## 2020-04-29 DIAGNOSIS — Y999 Unspecified external cause status: Secondary | ICD-10-CM | POA: Insufficient documentation

## 2020-04-29 DIAGNOSIS — R279 Unspecified lack of coordination: Secondary | ICD-10-CM | POA: Diagnosis not present

## 2020-04-29 DIAGNOSIS — R4182 Altered mental status, unspecified: Secondary | ICD-10-CM | POA: Diagnosis not present

## 2020-04-29 DIAGNOSIS — R404 Transient alteration of awareness: Secondary | ICD-10-CM | POA: Diagnosis not present

## 2020-04-29 DIAGNOSIS — I6782 Cerebral ischemia: Secondary | ICD-10-CM | POA: Diagnosis not present

## 2020-04-29 DIAGNOSIS — R197 Diarrhea, unspecified: Secondary | ICD-10-CM | POA: Diagnosis not present

## 2020-04-29 DIAGNOSIS — S0003XA Contusion of scalp, initial encounter: Secondary | ICD-10-CM | POA: Diagnosis not present

## 2020-04-29 DIAGNOSIS — S0093XA Contusion of unspecified part of head, initial encounter: Secondary | ICD-10-CM | POA: Diagnosis not present

## 2020-04-29 DIAGNOSIS — G319 Degenerative disease of nervous system, unspecified: Secondary | ICD-10-CM | POA: Diagnosis not present

## 2020-04-29 MED ORDER — DILTIAZEM HCL ER COATED BEADS 120 MG PO CP24
120.0000 mg | ORAL_CAPSULE | Freq: Once | ORAL | Status: AC
  Administered 2020-04-29: 120 mg via ORAL
  Filled 2020-04-29: qty 1

## 2020-04-29 MED ORDER — TRAMADOL HCL 50 MG PO TABS: 50.0000 mg | ORAL_TABLET | Freq: Four times a day (QID) | ORAL | 0 refills | Status: AC | PRN

## 2020-04-29 MED ORDER — DILTIAZEM HCL 25 MG/5ML IV SOLN
10.0000 mg | Freq: Once | INTRAVENOUS | Status: AC
  Administered 2020-04-29: 10 mg via INTRAVENOUS
  Filled 2020-04-29: qty 5

## 2020-04-29 NOTE — ED Provider Notes (Signed)
[facility] EMERGENCY DEPARTMENT Provider Note   CSN: [id number] Arrival date & time: 04/29/20  1325     History Chief Complaint  Patient presents with  . Fall    Dawn Buchanan is a 86 y.o. female.  HPI Patient had unwitnessed patient had an witnessed fall at memory care.  He had been sitting in a recliner and appears to have fallen forward out of it.  Patient son reports the patient does not remember to get help before getting up.  He reports that she frequently thinks she needs to go to the bathroom and will try to get up by herself.  She recently had a T11 compression fracture.  Had tried to keep her in a TLSO brace for comfort but patient was not cooperative with using it.  He reports that she has been getting some tramadol for pain, it seems helpful.  He reports the downside however is that when she is not in pain, she forgets about being careful getting up and thus is trying to get up and move around more.  Patient has known history of chronic atrial fibrillation.  She is not a candidate for anticoagulation due to frequent falls.  Past Medical History:  Diagnosis Date  . Dementia ([place])   . Hypertension     Patient Active Problem List   Diagnosis Date Noted  . Diverticulitis 01/12/2020  . AF (paroxysmal atrial fibrillation) ([place]) 01/12/2020  . Dementia without behavioral disturbance ([place]) 01/12/2020  . Chronic diastolic CHF (congestive heart failure) ([place]) 01/12/2020    History reviewed. No pertinent surgical history.   OB History   No obstetric history on file.     No family history on file.  Social History   Tobacco Use  . Smoking status: Never Smoker  . Smokeless tobacco: Never Used  Substance Use Topics  . Alcohol use: Not on file  . Drug use: Not on file    Home Medications Prior to Admission medications   Medication Sig Start Date End Date Taking? Authorizing Provider  acetaminophen (TYLENOL) 500 MG tablet Take 500 mg by mouth 3  (three) times daily.   Yes [provider]  diltiazem (CARDIZEM CD) 120 MG 24 hr capsule Take 1 capsule (120 mg total) by mouth daily. 01/14/20  Yes Mercy Riding, MD  memantine (NAMENDA XR) 28 MG CP24 24 hr capsule Take 1 capsule by mouth daily.   Yes [provider]  Mineral Oil OIL Place 2 drops into both ears every Friday.    Yes [provider]  traMADol (ULTRAM) 50 MG tablet Take 50 mg by mouth every 8 (eight) hours as needed for moderate pain.   Yes [provider]  traMADol (ULTRAM) 50 MG tablet Take 1 tablet (50 mg total) by mouth every 6 (six) hours as needed for moderate pain. 04/29/20   Charlesetta Shanks, MD    Allergies    Patient has no known allergies.  Review of Systems   Review of Systems Level 5 caveat cannot obtain review of systems due to dementia Physical Exam Updated Vital Signs BP 136/81   Pulse 94   Temp 99.3 F (37.4 C) (Oral)   Resp 20   Ht 5\' 3"  (1.6 m)   Wt 55.5 kg   SpO2 96%   BMI 21.67 kg/m   Physical Exam HENT:     Head:     Comments: 2 cm hematoma rt forehead    Nose: Nose normal.     Mouth/Throat:  Mouth: Mucous membranes are moist.     Pharynx: Oropharynx is clear.  Eyes:     Extraocular Movements: Extraocular movements intact.     Pupils: Pupils are equal, round, and reactive to light.  Cardiovascular:     Rate and Rhythm: Tachycardia present. Rhythm irregular.  Pulmonary:     Effort: Pulmonary effort is normal.     Breath sounds: Normal breath sounds.  Chest:     Chest wall: No tenderness.  Abdominal:     General: There is no distension.     Palpations: Abdomen is soft.     Tenderness: There is no abdominal tenderness.  Musculoskeletal:        General: No swelling or signs of injury. Normal range of motion.     Cervical back: Neck supple.     Comments: LE w/O deformity. All extremity through ROM w/o objection or pain. Pain with rolling or twisting motion of back  Skin:    General: Skin is warm  and dry.  Neurological:     Mental Status: She is alert.     Comments: Alert. HOH. Speech clear but confused.  Psychiatric:        Mood and Affect: Mood normal.     ED Results / Procedures / Treatments   Labs (all labs ordered are listed, but only abnormal results are displayed) Labs Reviewed - No data to display  EKG EKG Interpretation  Date/Time:  Friday April 29 2020 14:25:10 EDT Ventricular Rate:  147 PR Interval:    QRS Duration: 87 QT Interval:  316 QTC Calculation: 495 R Axis:   99 Text Interpretation: Atrial fibrillation with rapid V-rate Right axis deviation Repolarization abnormality, prob rate related afree, no change except increased rate Confirmed by Charlesetta Shanks [phone]) on 04/29/2020 4:33:26 PM   Radiology CT Head Wo Contrast  Result Date: 04/29/2020 CLINICAL DATA:  Unwitnessed fall.  Hematoma to right forehead. EXAM: CT HEAD WITHOUT CONTRAST TECHNIQUE: Contiguous axial images were obtained from the base of the skull through the vertex without intravenous contrast. COMPARISON:  Head CT 12 days ago 04/18/2020. FINDINGS: Brain: Stable degree of atrophy and chronic small vessel ischemia. No intracranial hemorrhage, mass effect, or midline shift. No hydrocephalus. The basilar cisterns are patent. No evidence of territorial infarct or acute ischemia. No extra-axial or intracranial fluid collection. Vascular: Atherosclerosis of skullbase vasculature without hyperdense vessel or abnormal calcification. Skull: No fracture or focal lesion. Sinuses/Orbits: Paranasal sinuses and mastoid air cells are clear. The visualized orbits are unremarkable. Bilateral cataract resection. Other: Right frontal scalp hematoma. Previous right temporal scalp hematoma has resolved. IMPRESSION: 1. Right frontal scalp hematoma. No acute intracranial abnormality. No skull fracture. 2. Stable atrophy and chronic small vessel ischemia. Electronically Signed   By: Keith Rake M.D.   On:  04/29/2020 16:04    Procedures Procedures (including critical care time)  Medications Ordered in ED Medications  diltiazem (CARDIZEM) injection 10 mg (10 mg Intravenous Given 04/29/20 1500)  diltiazem (CARDIZEM CD) 24 hr capsule 120 mg (120 mg Oral Given 04/29/20 1620)    ED Course  I have reviewed the triage vital signs and the nursing notes.  Pertinent labs & imaging results that were available during my care of the patient were reviewed by me and considered in my medical decision making (see chart for details).    MDM Rules/Calculators/A&P  Patient has frequent falls.  CT does not show any intracranial bleed.  Patient has known T11 fracture.  No appearance of other acute fracture.  Have placed her through range of motion upper lower extremities without objection or appearance of pain.  He does have pain that recreates twisting motion of the back as anticipated. At  This time do not think repeat imaging of back needed.  Patient has chronic atrial fibrillation.  Rate was elevated.  She was given 1 IV dose of Cardizem and her oral dose.  Signs of acute heart failure.  Lungs are clear.  Respiratory distress.  Patient can continue home medications.  Represcribed tramadol as this has been effective for pain in conjunction with extra strength Tylenol. Final Clinical Impression(s) / ED Diagnoses Final diagnoses:  Fall, initial encounter  Traumatic hematoma of forehead, initial encounter    Rx / DC Orders ED Discharge Orders         Ordered    traMADol (ULTRAM) 50 MG tablet  Every 6 hours PRN        04/29/20 1625           Charlesetta Shanks, MD 04/29/20 1636

## 2020-04-29 NOTE — ED Notes (Addendum)
D/c instructions given to son. RN attempted to call [facility] with no answer. RN called son and he states he will meet [facility] at the facility.

## 2020-04-29 NOTE — Discharge Instructions (Signed)
1.  Continue to take extra strength Tylenol every 8 hours for pain as needed.  You may also take tramadol 50 mg every 8 hours for pain. 2.  Observe for any changes in behavior or baseline function.  At this time, CT scan does not show any evidence of intracranial injury.  May apply cool packs to the swollen area on your forehead.  A well wrapped towel with ice for 20 minutes every several hours is helpful.

## 2020-04-29 NOTE — ED Triage Notes (Signed)
Pt brought to ED by [facility] from [facility] after unwitnessed fall by staff, pt has hx of dementia, AO to her self on arrival, small hematoma and bruise on her right forehead, recently  T11 fx for another fall. BP 150/80, HR 80, R -18, SPO2 100 RA CBG 113. No blood thinner.

## 2020-04-29 NOTE — ED Notes (Signed)
[facility] called to transport pt 

## 2020-04-29 NOTE — Care Management (Signed)
Patient is from [facility] and is medically cleared for discharge and will be returning to the facility via [facility].

## 2020-05-02 DIAGNOSIS — N3000 Acute cystitis without hematuria: Secondary | ICD-10-CM | POA: Diagnosis not present

## 2020-05-02 DIAGNOSIS — W19XXXA Unspecified fall, initial encounter: Secondary | ICD-10-CM | POA: Diagnosis not present

## 2020-05-04 DIAGNOSIS — M546 Pain in thoracic spine: Secondary | ICD-10-CM | POA: Diagnosis not present

## 2020-05-30 DIAGNOSIS — I10 Essential (primary) hypertension: Secondary | ICD-10-CM | POA: Diagnosis not present

## 2020-05-30 DIAGNOSIS — G301 Alzheimer's disease with late onset: Secondary | ICD-10-CM | POA: Diagnosis not present

## 2020-05-30 DIAGNOSIS — I480 Paroxysmal atrial fibrillation: Secondary | ICD-10-CM | POA: Diagnosis not present

## 2020-06-03 DIAGNOSIS — G301 Alzheimer's disease with late onset: Secondary | ICD-10-CM | POA: Diagnosis not present

## 2020-06-03 DIAGNOSIS — I480 Paroxysmal atrial fibrillation: Secondary | ICD-10-CM | POA: Diagnosis not present

## 2020-06-03 DIAGNOSIS — I10 Essential (primary) hypertension: Secondary | ICD-10-CM | POA: Diagnosis not present

## 2020-07-04 DIAGNOSIS — I10 Essential (primary) hypertension: Secondary | ICD-10-CM | POA: Diagnosis not present

## 2020-07-04 DIAGNOSIS — G301 Alzheimer's disease with late onset: Secondary | ICD-10-CM | POA: Diagnosis not present

## 2020-07-04 DIAGNOSIS — I480 Paroxysmal atrial fibrillation: Secondary | ICD-10-CM | POA: Diagnosis not present

## 2020-07-11 DIAGNOSIS — G301 Alzheimer's disease with late onset: Secondary | ICD-10-CM | POA: Diagnosis not present

## 2020-07-12 DIAGNOSIS — N390 Urinary tract infection, site not specified: Secondary | ICD-10-CM | POA: Diagnosis not present

## 2020-08-08 DIAGNOSIS — G301 Alzheimer's disease with late onset: Secondary | ICD-10-CM | POA: Diagnosis not present

## 2020-08-08 DIAGNOSIS — I480 Paroxysmal atrial fibrillation: Secondary | ICD-10-CM | POA: Diagnosis not present

## 2020-08-08 DIAGNOSIS — I10 Essential (primary) hypertension: Secondary | ICD-10-CM | POA: Diagnosis not present

## 2020-08-18 DIAGNOSIS — Z03818 Encounter for observation for suspected exposure to other biological agents ruled out: Secondary | ICD-10-CM | POA: Diagnosis not present

## 2020-09-01 DIAGNOSIS — Z03818 Encounter for observation for suspected exposure to other biological agents ruled out: Secondary | ICD-10-CM | POA: Diagnosis not present

## 2020-09-08 DIAGNOSIS — Z03818 Encounter for observation for suspected exposure to other biological agents ruled out: Secondary | ICD-10-CM | POA: Diagnosis not present

## 2020-09-14 DIAGNOSIS — Z03818 Encounter for observation for suspected exposure to other biological agents ruled out: Secondary | ICD-10-CM | POA: Diagnosis not present

## 2020-09-19 DIAGNOSIS — G301 Alzheimer's disease with late onset: Secondary | ICD-10-CM | POA: Diagnosis not present

## 2020-09-19 DIAGNOSIS — I480 Paroxysmal atrial fibrillation: Secondary | ICD-10-CM | POA: Diagnosis not present

## 2020-09-19 DIAGNOSIS — I10 Essential (primary) hypertension: Secondary | ICD-10-CM | POA: Diagnosis not present

## 2020-10-17 DIAGNOSIS — I480 Paroxysmal atrial fibrillation: Secondary | ICD-10-CM | POA: Diagnosis not present

## 2020-10-17 DIAGNOSIS — I10 Essential (primary) hypertension: Secondary | ICD-10-CM | POA: Diagnosis not present

## 2020-10-17 DIAGNOSIS — G301 Alzheimer's disease with late onset: Secondary | ICD-10-CM | POA: Diagnosis not present

## 2020-10-24 DIAGNOSIS — G301 Alzheimer's disease with late onset: Secondary | ICD-10-CM | POA: Diagnosis not present

## 2020-10-24 DIAGNOSIS — W19XXXA Unspecified fall, initial encounter: Secondary | ICD-10-CM | POA: Diagnosis not present

## 2020-11-14 DIAGNOSIS — I10 Essential (primary) hypertension: Secondary | ICD-10-CM | POA: Diagnosis not present

## 2020-11-14 DIAGNOSIS — W19XXXA Unspecified fall, initial encounter: Secondary | ICD-10-CM | POA: Diagnosis not present

## 2020-11-14 DIAGNOSIS — I480 Paroxysmal atrial fibrillation: Secondary | ICD-10-CM | POA: Diagnosis not present

## 2020-11-14 DIAGNOSIS — G301 Alzheimer's disease with late onset: Secondary | ICD-10-CM | POA: Diagnosis not present

## 2020-12-07 DIAGNOSIS — W19XXXA Unspecified fall, initial encounter: Secondary | ICD-10-CM | POA: Diagnosis not present

## 2020-12-07 DIAGNOSIS — S0990XA Unspecified injury of head, initial encounter: Secondary | ICD-10-CM | POA: Diagnosis not present

## 2020-12-07 DIAGNOSIS — I10 Essential (primary) hypertension: Secondary | ICD-10-CM | POA: Diagnosis not present

## 2020-12-07 DIAGNOSIS — I4891 Unspecified atrial fibrillation: Secondary | ICD-10-CM | POA: Diagnosis not present

## 2020-12-12 DIAGNOSIS — I10 Essential (primary) hypertension: Secondary | ICD-10-CM | POA: Diagnosis not present

## 2020-12-12 DIAGNOSIS — I480 Paroxysmal atrial fibrillation: Secondary | ICD-10-CM | POA: Diagnosis not present

## 2020-12-12 DIAGNOSIS — G301 Alzheimer's disease with late onset: Secondary | ICD-10-CM | POA: Diagnosis not present

## 2020-12-15 DIAGNOSIS — I10 Essential (primary) hypertension: Secondary | ICD-10-CM | POA: Diagnosis not present

## 2020-12-15 DIAGNOSIS — E119 Type 2 diabetes mellitus without complications: Secondary | ICD-10-CM | POA: Diagnosis not present

## 2020-12-15 DIAGNOSIS — E782 Mixed hyperlipidemia: Secondary | ICD-10-CM | POA: Diagnosis not present

## 2020-12-15 DIAGNOSIS — E039 Hypothyroidism, unspecified: Secondary | ICD-10-CM | POA: Diagnosis not present

## 2020-12-22 DIAGNOSIS — Z79899 Other long term (current) drug therapy: Secondary | ICD-10-CM | POA: Diagnosis not present

## 2021-01-09 DIAGNOSIS — G301 Alzheimer's disease with late onset: Secondary | ICD-10-CM | POA: Diagnosis not present

## 2021-01-09 DIAGNOSIS — E876 Hypokalemia: Secondary | ICD-10-CM | POA: Diagnosis not present

## 2021-01-09 DIAGNOSIS — I480 Paroxysmal atrial fibrillation: Secondary | ICD-10-CM | POA: Diagnosis not present

## 2021-01-09 DIAGNOSIS — I10 Essential (primary) hypertension: Secondary | ICD-10-CM | POA: Diagnosis not present

## 2021-02-13 DIAGNOSIS — E876 Hypokalemia: Secondary | ICD-10-CM | POA: Diagnosis not present

## 2021-02-13 DIAGNOSIS — I480 Paroxysmal atrial fibrillation: Secondary | ICD-10-CM | POA: Diagnosis not present

## 2021-02-13 DIAGNOSIS — I10 Essential (primary) hypertension: Secondary | ICD-10-CM | POA: Diagnosis not present

## 2021-02-13 DIAGNOSIS — G301 Alzheimer's disease with late onset: Secondary | ICD-10-CM | POA: Diagnosis not present

## 2021-03-13 DIAGNOSIS — I480 Paroxysmal atrial fibrillation: Secondary | ICD-10-CM | POA: Diagnosis not present

## 2021-03-13 DIAGNOSIS — G301 Alzheimer's disease with late onset: Secondary | ICD-10-CM | POA: Diagnosis not present

## 2021-03-13 DIAGNOSIS — I10 Essential (primary) hypertension: Secondary | ICD-10-CM | POA: Diagnosis not present

## 2021-03-13 DIAGNOSIS — E876 Hypokalemia: Secondary | ICD-10-CM | POA: Diagnosis not present

## 2021-03-15 DIAGNOSIS — I119 Hypertensive heart disease without heart failure: Secondary | ICD-10-CM | POA: Diagnosis not present

## 2021-03-15 DIAGNOSIS — I482 Chronic atrial fibrillation, unspecified: Secondary | ICD-10-CM | POA: Diagnosis not present

## 2021-03-15 DIAGNOSIS — E46 Unspecified protein-calorie malnutrition: Secondary | ICD-10-CM | POA: Diagnosis not present

## 2021-03-15 DIAGNOSIS — Z8719 Personal history of other diseases of the digestive system: Secondary | ICD-10-CM | POA: Diagnosis not present

## 2021-03-15 DIAGNOSIS — F0280 Dementia in other diseases classified elsewhere without behavioral disturbance: Secondary | ICD-10-CM | POA: Diagnosis not present

## 2021-03-15 DIAGNOSIS — G309 Alzheimer's disease, unspecified: Secondary | ICD-10-CM | POA: Diagnosis not present

## 2021-03-15 DIAGNOSIS — R634 Abnormal weight loss: Secondary | ICD-10-CM | POA: Diagnosis not present

## 2021-03-15 DIAGNOSIS — Z681 Body mass index (BMI) 19 or less, adult: Secondary | ICD-10-CM | POA: Diagnosis not present

## 2021-03-16 DIAGNOSIS — I119 Hypertensive heart disease without heart failure: Secondary | ICD-10-CM | POA: Diagnosis not present

## 2021-03-16 DIAGNOSIS — R634 Abnormal weight loss: Secondary | ICD-10-CM | POA: Diagnosis not present

## 2021-03-16 DIAGNOSIS — G309 Alzheimer's disease, unspecified: Secondary | ICD-10-CM | POA: Diagnosis not present

## 2021-03-16 DIAGNOSIS — E46 Unspecified protein-calorie malnutrition: Secondary | ICD-10-CM | POA: Diagnosis not present

## 2021-03-16 DIAGNOSIS — Z681 Body mass index (BMI) 19 or less, adult: Secondary | ICD-10-CM | POA: Diagnosis not present

## 2021-03-16 DIAGNOSIS — F0280 Dementia in other diseases classified elsewhere without behavioral disturbance: Secondary | ICD-10-CM | POA: Diagnosis not present

## 2021-03-17 DIAGNOSIS — E46 Unspecified protein-calorie malnutrition: Secondary | ICD-10-CM | POA: Diagnosis not present

## 2021-03-17 DIAGNOSIS — Z681 Body mass index (BMI) 19 or less, adult: Secondary | ICD-10-CM | POA: Diagnosis not present

## 2021-03-17 DIAGNOSIS — G309 Alzheimer's disease, unspecified: Secondary | ICD-10-CM | POA: Diagnosis not present

## 2021-03-17 DIAGNOSIS — I119 Hypertensive heart disease without heart failure: Secondary | ICD-10-CM | POA: Diagnosis not present

## 2021-03-17 DIAGNOSIS — F0280 Dementia in other diseases classified elsewhere without behavioral disturbance: Secondary | ICD-10-CM | POA: Diagnosis not present

## 2021-03-17 DIAGNOSIS — R634 Abnormal weight loss: Secondary | ICD-10-CM | POA: Diagnosis not present

## 2021-03-18 DIAGNOSIS — R634 Abnormal weight loss: Secondary | ICD-10-CM | POA: Diagnosis not present

## 2021-03-18 DIAGNOSIS — G309 Alzheimer's disease, unspecified: Secondary | ICD-10-CM | POA: Diagnosis not present

## 2021-03-18 DIAGNOSIS — E46 Unspecified protein-calorie malnutrition: Secondary | ICD-10-CM | POA: Diagnosis not present

## 2021-03-18 DIAGNOSIS — I119 Hypertensive heart disease without heart failure: Secondary | ICD-10-CM | POA: Diagnosis not present

## 2021-03-18 DIAGNOSIS — Z681 Body mass index (BMI) 19 or less, adult: Secondary | ICD-10-CM | POA: Diagnosis not present

## 2021-03-18 DIAGNOSIS — F0280 Dementia in other diseases classified elsewhere without behavioral disturbance: Secondary | ICD-10-CM | POA: Diagnosis not present

## 2021-03-20 DIAGNOSIS — E46 Unspecified protein-calorie malnutrition: Secondary | ICD-10-CM | POA: Diagnosis not present

## 2021-03-20 DIAGNOSIS — F0280 Dementia in other diseases classified elsewhere without behavioral disturbance: Secondary | ICD-10-CM | POA: Diagnosis not present

## 2021-03-20 DIAGNOSIS — I482 Chronic atrial fibrillation, unspecified: Secondary | ICD-10-CM | POA: Diagnosis not present

## 2021-03-20 DIAGNOSIS — Z681 Body mass index (BMI) 19 or less, adult: Secondary | ICD-10-CM | POA: Diagnosis not present

## 2021-03-20 DIAGNOSIS — R634 Abnormal weight loss: Secondary | ICD-10-CM | POA: Diagnosis not present

## 2021-03-20 DIAGNOSIS — G309 Alzheimer's disease, unspecified: Secondary | ICD-10-CM | POA: Diagnosis not present

## 2021-03-20 DIAGNOSIS — I119 Hypertensive heart disease without heart failure: Secondary | ICD-10-CM | POA: Diagnosis not present

## 2021-03-20 DIAGNOSIS — Z8719 Personal history of other diseases of the digestive system: Secondary | ICD-10-CM | POA: Diagnosis not present

## 2021-03-21 DIAGNOSIS — E46 Unspecified protein-calorie malnutrition: Secondary | ICD-10-CM | POA: Diagnosis not present

## 2021-03-21 DIAGNOSIS — F0280 Dementia in other diseases classified elsewhere without behavioral disturbance: Secondary | ICD-10-CM | POA: Diagnosis not present

## 2021-03-21 DIAGNOSIS — G309 Alzheimer's disease, unspecified: Secondary | ICD-10-CM | POA: Diagnosis not present

## 2021-03-21 DIAGNOSIS — Z681 Body mass index (BMI) 19 or less, adult: Secondary | ICD-10-CM | POA: Diagnosis not present

## 2021-03-21 DIAGNOSIS — I119 Hypertensive heart disease without heart failure: Secondary | ICD-10-CM | POA: Diagnosis not present

## 2021-03-21 DIAGNOSIS — R634 Abnormal weight loss: Secondary | ICD-10-CM | POA: Diagnosis not present

## 2021-03-24 DIAGNOSIS — Z681 Body mass index (BMI) 19 or less, adult: Secondary | ICD-10-CM | POA: Diagnosis not present

## 2021-03-24 DIAGNOSIS — F0280 Dementia in other diseases classified elsewhere without behavioral disturbance: Secondary | ICD-10-CM | POA: Diagnosis not present

## 2021-03-24 DIAGNOSIS — R634 Abnormal weight loss: Secondary | ICD-10-CM | POA: Diagnosis not present

## 2021-03-24 DIAGNOSIS — I119 Hypertensive heart disease without heart failure: Secondary | ICD-10-CM | POA: Diagnosis not present

## 2021-03-24 DIAGNOSIS — E46 Unspecified protein-calorie malnutrition: Secondary | ICD-10-CM | POA: Diagnosis not present

## 2021-03-24 DIAGNOSIS — G309 Alzheimer's disease, unspecified: Secondary | ICD-10-CM | POA: Diagnosis not present

## 2021-03-29 DIAGNOSIS — Z681 Body mass index (BMI) 19 or less, adult: Secondary | ICD-10-CM | POA: Diagnosis not present

## 2021-03-29 DIAGNOSIS — I119 Hypertensive heart disease without heart failure: Secondary | ICD-10-CM | POA: Diagnosis not present

## 2021-03-29 DIAGNOSIS — E46 Unspecified protein-calorie malnutrition: Secondary | ICD-10-CM | POA: Diagnosis not present

## 2021-03-29 DIAGNOSIS — G309 Alzheimer's disease, unspecified: Secondary | ICD-10-CM | POA: Diagnosis not present

## 2021-03-29 DIAGNOSIS — R634 Abnormal weight loss: Secondary | ICD-10-CM | POA: Diagnosis not present

## 2021-03-29 DIAGNOSIS — F0280 Dementia in other diseases classified elsewhere without behavioral disturbance: Secondary | ICD-10-CM | POA: Diagnosis not present

## 2021-03-31 DIAGNOSIS — R634 Abnormal weight loss: Secondary | ICD-10-CM | POA: Diagnosis not present

## 2021-03-31 DIAGNOSIS — E46 Unspecified protein-calorie malnutrition: Secondary | ICD-10-CM | POA: Diagnosis not present

## 2021-03-31 DIAGNOSIS — I119 Hypertensive heart disease without heart failure: Secondary | ICD-10-CM | POA: Diagnosis not present

## 2021-03-31 DIAGNOSIS — Z681 Body mass index (BMI) 19 or less, adult: Secondary | ICD-10-CM | POA: Diagnosis not present

## 2021-03-31 DIAGNOSIS — G309 Alzheimer's disease, unspecified: Secondary | ICD-10-CM | POA: Diagnosis not present

## 2021-03-31 DIAGNOSIS — F0280 Dementia in other diseases classified elsewhere without behavioral disturbance: Secondary | ICD-10-CM | POA: Diagnosis not present

## 2021-04-03 DIAGNOSIS — G309 Alzheimer's disease, unspecified: Secondary | ICD-10-CM | POA: Diagnosis not present

## 2021-04-03 DIAGNOSIS — E46 Unspecified protein-calorie malnutrition: Secondary | ICD-10-CM | POA: Diagnosis not present

## 2021-04-03 DIAGNOSIS — F0280 Dementia in other diseases classified elsewhere without behavioral disturbance: Secondary | ICD-10-CM | POA: Diagnosis not present

## 2021-04-03 DIAGNOSIS — I119 Hypertensive heart disease without heart failure: Secondary | ICD-10-CM | POA: Diagnosis not present

## 2021-04-03 DIAGNOSIS — Z681 Body mass index (BMI) 19 or less, adult: Secondary | ICD-10-CM | POA: Diagnosis not present

## 2021-04-03 DIAGNOSIS — R634 Abnormal weight loss: Secondary | ICD-10-CM | POA: Diagnosis not present

## 2021-04-04 DIAGNOSIS — G309 Alzheimer's disease, unspecified: Secondary | ICD-10-CM | POA: Diagnosis not present

## 2021-04-04 DIAGNOSIS — R634 Abnormal weight loss: Secondary | ICD-10-CM | POA: Diagnosis not present

## 2021-04-04 DIAGNOSIS — F0280 Dementia in other diseases classified elsewhere without behavioral disturbance: Secondary | ICD-10-CM | POA: Diagnosis not present

## 2021-04-04 DIAGNOSIS — Z681 Body mass index (BMI) 19 or less, adult: Secondary | ICD-10-CM | POA: Diagnosis not present

## 2021-04-04 DIAGNOSIS — E46 Unspecified protein-calorie malnutrition: Secondary | ICD-10-CM | POA: Diagnosis not present

## 2021-04-04 DIAGNOSIS — I119 Hypertensive heart disease without heart failure: Secondary | ICD-10-CM | POA: Diagnosis not present

## 2021-04-07 DIAGNOSIS — I119 Hypertensive heart disease without heart failure: Secondary | ICD-10-CM | POA: Diagnosis not present

## 2021-04-07 DIAGNOSIS — Z681 Body mass index (BMI) 19 or less, adult: Secondary | ICD-10-CM | POA: Diagnosis not present

## 2021-04-07 DIAGNOSIS — E46 Unspecified protein-calorie malnutrition: Secondary | ICD-10-CM | POA: Diagnosis not present

## 2021-04-07 DIAGNOSIS — R634 Abnormal weight loss: Secondary | ICD-10-CM | POA: Diagnosis not present

## 2021-04-07 DIAGNOSIS — F0280 Dementia in other diseases classified elsewhere without behavioral disturbance: Secondary | ICD-10-CM | POA: Diagnosis not present

## 2021-04-07 DIAGNOSIS — G309 Alzheimer's disease, unspecified: Secondary | ICD-10-CM | POA: Diagnosis not present

## 2021-04-11 DIAGNOSIS — Z681 Body mass index (BMI) 19 or less, adult: Secondary | ICD-10-CM | POA: Diagnosis not present

## 2021-04-11 DIAGNOSIS — G309 Alzheimer's disease, unspecified: Secondary | ICD-10-CM | POA: Diagnosis not present

## 2021-04-11 DIAGNOSIS — F0280 Dementia in other diseases classified elsewhere without behavioral disturbance: Secondary | ICD-10-CM | POA: Diagnosis not present

## 2021-04-11 DIAGNOSIS — I119 Hypertensive heart disease without heart failure: Secondary | ICD-10-CM | POA: Diagnosis not present

## 2021-04-11 DIAGNOSIS — E46 Unspecified protein-calorie malnutrition: Secondary | ICD-10-CM | POA: Diagnosis not present

## 2021-04-11 DIAGNOSIS — R634 Abnormal weight loss: Secondary | ICD-10-CM | POA: Diagnosis not present

## 2021-04-12 DIAGNOSIS — G301 Alzheimer's disease with late onset: Secondary | ICD-10-CM | POA: Diagnosis not present

## 2021-04-12 DIAGNOSIS — I10 Essential (primary) hypertension: Secondary | ICD-10-CM | POA: Diagnosis not present

## 2021-04-12 DIAGNOSIS — I480 Paroxysmal atrial fibrillation: Secondary | ICD-10-CM | POA: Diagnosis not present

## 2021-04-14 DIAGNOSIS — G309 Alzheimer's disease, unspecified: Secondary | ICD-10-CM | POA: Diagnosis not present

## 2021-04-14 DIAGNOSIS — E46 Unspecified protein-calorie malnutrition: Secondary | ICD-10-CM | POA: Diagnosis not present

## 2021-04-14 DIAGNOSIS — F0280 Dementia in other diseases classified elsewhere without behavioral disturbance: Secondary | ICD-10-CM | POA: Diagnosis not present

## 2021-04-14 DIAGNOSIS — Z681 Body mass index (BMI) 19 or less, adult: Secondary | ICD-10-CM | POA: Diagnosis not present

## 2021-04-14 DIAGNOSIS — I119 Hypertensive heart disease without heart failure: Secondary | ICD-10-CM | POA: Diagnosis not present

## 2021-04-14 DIAGNOSIS — R634 Abnormal weight loss: Secondary | ICD-10-CM | POA: Diagnosis not present

## 2021-04-18 DIAGNOSIS — G309 Alzheimer's disease, unspecified: Secondary | ICD-10-CM | POA: Diagnosis not present

## 2021-04-18 DIAGNOSIS — E46 Unspecified protein-calorie malnutrition: Secondary | ICD-10-CM | POA: Diagnosis not present

## 2021-04-18 DIAGNOSIS — F0280 Dementia in other diseases classified elsewhere without behavioral disturbance: Secondary | ICD-10-CM | POA: Diagnosis not present

## 2021-04-18 DIAGNOSIS — Z681 Body mass index (BMI) 19 or less, adult: Secondary | ICD-10-CM | POA: Diagnosis not present

## 2021-04-18 DIAGNOSIS — I119 Hypertensive heart disease without heart failure: Secondary | ICD-10-CM | POA: Diagnosis not present

## 2021-04-18 DIAGNOSIS — R634 Abnormal weight loss: Secondary | ICD-10-CM | POA: Diagnosis not present

## 2021-04-20 DIAGNOSIS — F0280 Dementia in other diseases classified elsewhere without behavioral disturbance: Secondary | ICD-10-CM | POA: Diagnosis not present

## 2021-04-20 DIAGNOSIS — I482 Chronic atrial fibrillation, unspecified: Secondary | ICD-10-CM | POA: Diagnosis not present

## 2021-04-20 DIAGNOSIS — R634 Abnormal weight loss: Secondary | ICD-10-CM | POA: Diagnosis not present

## 2021-04-20 DIAGNOSIS — E46 Unspecified protein-calorie malnutrition: Secondary | ICD-10-CM | POA: Diagnosis not present

## 2021-04-20 DIAGNOSIS — Z681 Body mass index (BMI) 19 or less, adult: Secondary | ICD-10-CM | POA: Diagnosis not present

## 2021-04-20 DIAGNOSIS — Z8719 Personal history of other diseases of the digestive system: Secondary | ICD-10-CM | POA: Diagnosis not present

## 2021-04-20 DIAGNOSIS — G309 Alzheimer's disease, unspecified: Secondary | ICD-10-CM | POA: Diagnosis not present

## 2021-04-20 DIAGNOSIS — I119 Hypertensive heart disease without heart failure: Secondary | ICD-10-CM | POA: Diagnosis not present

## 2021-04-21 DIAGNOSIS — G309 Alzheimer's disease, unspecified: Secondary | ICD-10-CM | POA: Diagnosis not present

## 2021-04-21 DIAGNOSIS — I119 Hypertensive heart disease without heart failure: Secondary | ICD-10-CM | POA: Diagnosis not present

## 2021-04-21 DIAGNOSIS — E46 Unspecified protein-calorie malnutrition: Secondary | ICD-10-CM | POA: Diagnosis not present

## 2021-04-21 DIAGNOSIS — Z681 Body mass index (BMI) 19 or less, adult: Secondary | ICD-10-CM | POA: Diagnosis not present

## 2021-04-21 DIAGNOSIS — F0280 Dementia in other diseases classified elsewhere without behavioral disturbance: Secondary | ICD-10-CM | POA: Diagnosis not present

## 2021-04-21 DIAGNOSIS — R634 Abnormal weight loss: Secondary | ICD-10-CM | POA: Diagnosis not present

## 2021-04-25 DIAGNOSIS — F0280 Dementia in other diseases classified elsewhere without behavioral disturbance: Secondary | ICD-10-CM | POA: Diagnosis not present

## 2021-04-25 DIAGNOSIS — E46 Unspecified protein-calorie malnutrition: Secondary | ICD-10-CM | POA: Diagnosis not present

## 2021-04-25 DIAGNOSIS — I119 Hypertensive heart disease without heart failure: Secondary | ICD-10-CM | POA: Diagnosis not present

## 2021-04-25 DIAGNOSIS — Z681 Body mass index (BMI) 19 or less, adult: Secondary | ICD-10-CM | POA: Diagnosis not present

## 2021-04-25 DIAGNOSIS — G309 Alzheimer's disease, unspecified: Secondary | ICD-10-CM | POA: Diagnosis not present

## 2021-04-25 DIAGNOSIS — R634 Abnormal weight loss: Secondary | ICD-10-CM | POA: Diagnosis not present

## 2021-04-28 DIAGNOSIS — R634 Abnormal weight loss: Secondary | ICD-10-CM | POA: Diagnosis not present

## 2021-04-28 DIAGNOSIS — I119 Hypertensive heart disease without heart failure: Secondary | ICD-10-CM | POA: Diagnosis not present

## 2021-04-28 DIAGNOSIS — Z681 Body mass index (BMI) 19 or less, adult: Secondary | ICD-10-CM | POA: Diagnosis not present

## 2021-04-28 DIAGNOSIS — F0280 Dementia in other diseases classified elsewhere without behavioral disturbance: Secondary | ICD-10-CM | POA: Diagnosis not present

## 2021-04-28 DIAGNOSIS — E46 Unspecified protein-calorie malnutrition: Secondary | ICD-10-CM | POA: Diagnosis not present

## 2021-04-28 DIAGNOSIS — G309 Alzheimer's disease, unspecified: Secondary | ICD-10-CM | POA: Diagnosis not present

## 2021-05-05 DIAGNOSIS — E46 Unspecified protein-calorie malnutrition: Secondary | ICD-10-CM | POA: Diagnosis not present

## 2021-05-05 DIAGNOSIS — I119 Hypertensive heart disease without heart failure: Secondary | ICD-10-CM | POA: Diagnosis not present

## 2021-05-05 DIAGNOSIS — R634 Abnormal weight loss: Secondary | ICD-10-CM | POA: Diagnosis not present

## 2021-05-05 DIAGNOSIS — F0280 Dementia in other diseases classified elsewhere without behavioral disturbance: Secondary | ICD-10-CM | POA: Diagnosis not present

## 2021-05-05 DIAGNOSIS — G309 Alzheimer's disease, unspecified: Secondary | ICD-10-CM | POA: Diagnosis not present

## 2021-05-05 DIAGNOSIS — Z681 Body mass index (BMI) 19 or less, adult: Secondary | ICD-10-CM | POA: Diagnosis not present

## 2021-05-08 DIAGNOSIS — R634 Abnormal weight loss: Secondary | ICD-10-CM | POA: Diagnosis not present

## 2021-05-08 DIAGNOSIS — G309 Alzheimer's disease, unspecified: Secondary | ICD-10-CM | POA: Diagnosis not present

## 2021-05-08 DIAGNOSIS — E46 Unspecified protein-calorie malnutrition: Secondary | ICD-10-CM | POA: Diagnosis not present

## 2021-05-08 DIAGNOSIS — F0280 Dementia in other diseases classified elsewhere without behavioral disturbance: Secondary | ICD-10-CM | POA: Diagnosis not present

## 2021-05-08 DIAGNOSIS — I119 Hypertensive heart disease without heart failure: Secondary | ICD-10-CM | POA: Diagnosis not present

## 2021-05-08 DIAGNOSIS — Z681 Body mass index (BMI) 19 or less, adult: Secondary | ICD-10-CM | POA: Diagnosis not present

## 2021-05-10 DIAGNOSIS — Z23 Encounter for immunization: Secondary | ICD-10-CM | POA: Diagnosis not present

## 2021-05-12 DIAGNOSIS — I119 Hypertensive heart disease without heart failure: Secondary | ICD-10-CM | POA: Diagnosis not present

## 2021-05-12 DIAGNOSIS — R634 Abnormal weight loss: Secondary | ICD-10-CM | POA: Diagnosis not present

## 2021-05-12 DIAGNOSIS — F0280 Dementia in other diseases classified elsewhere without behavioral disturbance: Secondary | ICD-10-CM | POA: Diagnosis not present

## 2021-05-12 DIAGNOSIS — E46 Unspecified protein-calorie malnutrition: Secondary | ICD-10-CM | POA: Diagnosis not present

## 2021-05-12 DIAGNOSIS — Z681 Body mass index (BMI) 19 or less, adult: Secondary | ICD-10-CM | POA: Diagnosis not present

## 2021-05-12 DIAGNOSIS — G309 Alzheimer's disease, unspecified: Secondary | ICD-10-CM | POA: Diagnosis not present

## 2021-05-15 DIAGNOSIS — N1831 Chronic kidney disease, stage 3a: Secondary | ICD-10-CM | POA: Diagnosis not present

## 2021-05-15 DIAGNOSIS — I480 Paroxysmal atrial fibrillation: Secondary | ICD-10-CM | POA: Diagnosis not present

## 2021-05-15 DIAGNOSIS — G301 Alzheimer's disease with late onset: Secondary | ICD-10-CM | POA: Diagnosis not present

## 2021-05-15 DIAGNOSIS — I129 Hypertensive chronic kidney disease with stage 1 through stage 4 chronic kidney disease, or unspecified chronic kidney disease: Secondary | ICD-10-CM | POA: Diagnosis not present

## 2021-05-16 DIAGNOSIS — G309 Alzheimer's disease, unspecified: Secondary | ICD-10-CM | POA: Diagnosis not present

## 2021-05-16 DIAGNOSIS — Z681 Body mass index (BMI) 19 or less, adult: Secondary | ICD-10-CM | POA: Diagnosis not present

## 2021-05-16 DIAGNOSIS — E46 Unspecified protein-calorie malnutrition: Secondary | ICD-10-CM | POA: Diagnosis not present

## 2021-05-16 DIAGNOSIS — R634 Abnormal weight loss: Secondary | ICD-10-CM | POA: Diagnosis not present

## 2021-05-16 DIAGNOSIS — I119 Hypertensive heart disease without heart failure: Secondary | ICD-10-CM | POA: Diagnosis not present

## 2021-05-16 DIAGNOSIS — F0280 Dementia in other diseases classified elsewhere without behavioral disturbance: Secondary | ICD-10-CM | POA: Diagnosis not present

## 2021-05-19 DIAGNOSIS — G309 Alzheimer's disease, unspecified: Secondary | ICD-10-CM | POA: Diagnosis not present

## 2021-05-19 DIAGNOSIS — E46 Unspecified protein-calorie malnutrition: Secondary | ICD-10-CM | POA: Diagnosis not present

## 2021-05-19 DIAGNOSIS — I119 Hypertensive heart disease without heart failure: Secondary | ICD-10-CM | POA: Diagnosis not present

## 2021-05-19 DIAGNOSIS — R634 Abnormal weight loss: Secondary | ICD-10-CM | POA: Diagnosis not present

## 2021-05-19 DIAGNOSIS — F0280 Dementia in other diseases classified elsewhere without behavioral disturbance: Secondary | ICD-10-CM | POA: Diagnosis not present

## 2021-05-19 DIAGNOSIS — Z681 Body mass index (BMI) 19 or less, adult: Secondary | ICD-10-CM | POA: Diagnosis not present

## 2021-05-20 DIAGNOSIS — Z8719 Personal history of other diseases of the digestive system: Secondary | ICD-10-CM | POA: Diagnosis not present

## 2021-05-20 DIAGNOSIS — R634 Abnormal weight loss: Secondary | ICD-10-CM | POA: Diagnosis not present

## 2021-05-20 DIAGNOSIS — I119 Hypertensive heart disease without heart failure: Secondary | ICD-10-CM | POA: Diagnosis not present

## 2021-05-20 DIAGNOSIS — F0280 Dementia in other diseases classified elsewhere without behavioral disturbance: Secondary | ICD-10-CM | POA: Diagnosis not present

## 2021-05-20 DIAGNOSIS — I482 Chronic atrial fibrillation, unspecified: Secondary | ICD-10-CM | POA: Diagnosis not present

## 2021-05-20 DIAGNOSIS — E46 Unspecified protein-calorie malnutrition: Secondary | ICD-10-CM | POA: Diagnosis not present

## 2021-05-20 DIAGNOSIS — Z681 Body mass index (BMI) 19 or less, adult: Secondary | ICD-10-CM | POA: Diagnosis not present

## 2021-05-20 DIAGNOSIS — G309 Alzheimer's disease, unspecified: Secondary | ICD-10-CM | POA: Diagnosis not present

## 2021-05-23 DIAGNOSIS — F0280 Dementia in other diseases classified elsewhere without behavioral disturbance: Secondary | ICD-10-CM | POA: Diagnosis not present

## 2021-05-23 DIAGNOSIS — Z681 Body mass index (BMI) 19 or less, adult: Secondary | ICD-10-CM | POA: Diagnosis not present

## 2021-05-23 DIAGNOSIS — I119 Hypertensive heart disease without heart failure: Secondary | ICD-10-CM | POA: Diagnosis not present

## 2021-05-23 DIAGNOSIS — R634 Abnormal weight loss: Secondary | ICD-10-CM | POA: Diagnosis not present

## 2021-05-23 DIAGNOSIS — G309 Alzheimer's disease, unspecified: Secondary | ICD-10-CM | POA: Diagnosis not present

## 2021-05-23 DIAGNOSIS — E46 Unspecified protein-calorie malnutrition: Secondary | ICD-10-CM | POA: Diagnosis not present

## 2021-05-26 DIAGNOSIS — G309 Alzheimer's disease, unspecified: Secondary | ICD-10-CM | POA: Diagnosis not present

## 2021-05-26 DIAGNOSIS — R634 Abnormal weight loss: Secondary | ICD-10-CM | POA: Diagnosis not present

## 2021-05-26 DIAGNOSIS — E46 Unspecified protein-calorie malnutrition: Secondary | ICD-10-CM | POA: Diagnosis not present

## 2021-05-26 DIAGNOSIS — F0280 Dementia in other diseases classified elsewhere without behavioral disturbance: Secondary | ICD-10-CM | POA: Diagnosis not present

## 2021-05-26 DIAGNOSIS — Z681 Body mass index (BMI) 19 or less, adult: Secondary | ICD-10-CM | POA: Diagnosis not present

## 2021-05-26 DIAGNOSIS — I119 Hypertensive heart disease without heart failure: Secondary | ICD-10-CM | POA: Diagnosis not present

## 2021-05-30 DIAGNOSIS — I119 Hypertensive heart disease without heart failure: Secondary | ICD-10-CM | POA: Diagnosis not present

## 2021-05-30 DIAGNOSIS — F0280 Dementia in other diseases classified elsewhere without behavioral disturbance: Secondary | ICD-10-CM | POA: Diagnosis not present

## 2021-05-30 DIAGNOSIS — Z681 Body mass index (BMI) 19 or less, adult: Secondary | ICD-10-CM | POA: Diagnosis not present

## 2021-05-30 DIAGNOSIS — E46 Unspecified protein-calorie malnutrition: Secondary | ICD-10-CM | POA: Diagnosis not present

## 2021-05-30 DIAGNOSIS — R634 Abnormal weight loss: Secondary | ICD-10-CM | POA: Diagnosis not present

## 2021-05-30 DIAGNOSIS — G309 Alzheimer's disease, unspecified: Secondary | ICD-10-CM | POA: Diagnosis not present

## 2021-05-31 DIAGNOSIS — F0280 Dementia in other diseases classified elsewhere without behavioral disturbance: Secondary | ICD-10-CM | POA: Diagnosis not present

## 2021-05-31 DIAGNOSIS — E46 Unspecified protein-calorie malnutrition: Secondary | ICD-10-CM | POA: Diagnosis not present

## 2021-05-31 DIAGNOSIS — I119 Hypertensive heart disease without heart failure: Secondary | ICD-10-CM | POA: Diagnosis not present

## 2021-05-31 DIAGNOSIS — G309 Alzheimer's disease, unspecified: Secondary | ICD-10-CM | POA: Diagnosis not present

## 2021-05-31 DIAGNOSIS — Z681 Body mass index (BMI) 19 or less, adult: Secondary | ICD-10-CM | POA: Diagnosis not present

## 2021-05-31 DIAGNOSIS — R634 Abnormal weight loss: Secondary | ICD-10-CM | POA: Diagnosis not present

## 2021-06-02 DIAGNOSIS — G309 Alzheimer's disease, unspecified: Secondary | ICD-10-CM | POA: Diagnosis not present

## 2021-06-02 DIAGNOSIS — Z681 Body mass index (BMI) 19 or less, adult: Secondary | ICD-10-CM | POA: Diagnosis not present

## 2021-06-02 DIAGNOSIS — E46 Unspecified protein-calorie malnutrition: Secondary | ICD-10-CM | POA: Diagnosis not present

## 2021-06-02 DIAGNOSIS — F0280 Dementia in other diseases classified elsewhere without behavioral disturbance: Secondary | ICD-10-CM | POA: Diagnosis not present

## 2021-06-02 DIAGNOSIS — I119 Hypertensive heart disease without heart failure: Secondary | ICD-10-CM | POA: Diagnosis not present

## 2021-06-02 DIAGNOSIS — R634 Abnormal weight loss: Secondary | ICD-10-CM | POA: Diagnosis not present

## 2021-06-06 DIAGNOSIS — I119 Hypertensive heart disease without heart failure: Secondary | ICD-10-CM | POA: Diagnosis not present

## 2021-06-06 DIAGNOSIS — Z681 Body mass index (BMI) 19 or less, adult: Secondary | ICD-10-CM | POA: Diagnosis not present

## 2021-06-06 DIAGNOSIS — G309 Alzheimer's disease, unspecified: Secondary | ICD-10-CM | POA: Diagnosis not present

## 2021-06-06 DIAGNOSIS — R634 Abnormal weight loss: Secondary | ICD-10-CM | POA: Diagnosis not present

## 2021-06-06 DIAGNOSIS — F0280 Dementia in other diseases classified elsewhere without behavioral disturbance: Secondary | ICD-10-CM | POA: Diagnosis not present

## 2021-06-06 DIAGNOSIS — E46 Unspecified protein-calorie malnutrition: Secondary | ICD-10-CM | POA: Diagnosis not present

## 2021-06-07 DIAGNOSIS — I119 Hypertensive heart disease without heart failure: Secondary | ICD-10-CM | POA: Diagnosis not present

## 2021-06-07 DIAGNOSIS — F0280 Dementia in other diseases classified elsewhere without behavioral disturbance: Secondary | ICD-10-CM | POA: Diagnosis not present

## 2021-06-07 DIAGNOSIS — E46 Unspecified protein-calorie malnutrition: Secondary | ICD-10-CM | POA: Diagnosis not present

## 2021-06-07 DIAGNOSIS — Z681 Body mass index (BMI) 19 or less, adult: Secondary | ICD-10-CM | POA: Diagnosis not present

## 2021-06-07 DIAGNOSIS — R634 Abnormal weight loss: Secondary | ICD-10-CM | POA: Diagnosis not present

## 2021-06-07 DIAGNOSIS — G309 Alzheimer's disease, unspecified: Secondary | ICD-10-CM | POA: Diagnosis not present

## 2021-06-09 DIAGNOSIS — I119 Hypertensive heart disease without heart failure: Secondary | ICD-10-CM | POA: Diagnosis not present

## 2021-06-09 DIAGNOSIS — Z681 Body mass index (BMI) 19 or less, adult: Secondary | ICD-10-CM | POA: Diagnosis not present

## 2021-06-09 DIAGNOSIS — R634 Abnormal weight loss: Secondary | ICD-10-CM | POA: Diagnosis not present

## 2021-06-09 DIAGNOSIS — E46 Unspecified protein-calorie malnutrition: Secondary | ICD-10-CM | POA: Diagnosis not present

## 2021-06-09 DIAGNOSIS — G309 Alzheimer's disease, unspecified: Secondary | ICD-10-CM | POA: Diagnosis not present

## 2021-06-09 DIAGNOSIS — F0280 Dementia in other diseases classified elsewhere without behavioral disturbance: Secondary | ICD-10-CM | POA: Diagnosis not present

## 2021-06-12 DIAGNOSIS — G301 Alzheimer's disease with late onset: Secondary | ICD-10-CM | POA: Diagnosis not present

## 2021-06-12 DIAGNOSIS — I129 Hypertensive chronic kidney disease with stage 1 through stage 4 chronic kidney disease, or unspecified chronic kidney disease: Secondary | ICD-10-CM | POA: Diagnosis not present

## 2021-06-12 DIAGNOSIS — N1831 Chronic kidney disease, stage 3a: Secondary | ICD-10-CM | POA: Diagnosis not present

## 2021-06-12 DIAGNOSIS — E8881 Metabolic syndrome: Secondary | ICD-10-CM | POA: Diagnosis not present

## 2021-06-12 DIAGNOSIS — I480 Paroxysmal atrial fibrillation: Secondary | ICD-10-CM | POA: Diagnosis not present

## 2021-06-13 DIAGNOSIS — Z681 Body mass index (BMI) 19 or less, adult: Secondary | ICD-10-CM | POA: Diagnosis not present

## 2021-06-13 DIAGNOSIS — E46 Unspecified protein-calorie malnutrition: Secondary | ICD-10-CM | POA: Diagnosis not present

## 2021-06-13 DIAGNOSIS — I119 Hypertensive heart disease without heart failure: Secondary | ICD-10-CM | POA: Diagnosis not present

## 2021-06-13 DIAGNOSIS — F0280 Dementia in other diseases classified elsewhere without behavioral disturbance: Secondary | ICD-10-CM | POA: Diagnosis not present

## 2021-06-13 DIAGNOSIS — G309 Alzheimer's disease, unspecified: Secondary | ICD-10-CM | POA: Diagnosis not present

## 2021-06-13 DIAGNOSIS — R634 Abnormal weight loss: Secondary | ICD-10-CM | POA: Diagnosis not present

## 2021-06-14 DIAGNOSIS — R634 Abnormal weight loss: Secondary | ICD-10-CM | POA: Diagnosis not present

## 2021-06-14 DIAGNOSIS — G309 Alzheimer's disease, unspecified: Secondary | ICD-10-CM | POA: Diagnosis not present

## 2021-06-14 DIAGNOSIS — Z681 Body mass index (BMI) 19 or less, adult: Secondary | ICD-10-CM | POA: Diagnosis not present

## 2021-06-14 DIAGNOSIS — F0280 Dementia in other diseases classified elsewhere without behavioral disturbance: Secondary | ICD-10-CM | POA: Diagnosis not present

## 2021-06-14 DIAGNOSIS — E46 Unspecified protein-calorie malnutrition: Secondary | ICD-10-CM | POA: Diagnosis not present

## 2021-06-14 DIAGNOSIS — I119 Hypertensive heart disease without heart failure: Secondary | ICD-10-CM | POA: Diagnosis not present

## 2021-06-16 DIAGNOSIS — R634 Abnormal weight loss: Secondary | ICD-10-CM | POA: Diagnosis not present

## 2021-06-16 DIAGNOSIS — Z681 Body mass index (BMI) 19 or less, adult: Secondary | ICD-10-CM | POA: Diagnosis not present

## 2021-06-16 DIAGNOSIS — I119 Hypertensive heart disease without heart failure: Secondary | ICD-10-CM | POA: Diagnosis not present

## 2021-06-16 DIAGNOSIS — E46 Unspecified protein-calorie malnutrition: Secondary | ICD-10-CM | POA: Diagnosis not present

## 2021-06-16 DIAGNOSIS — G309 Alzheimer's disease, unspecified: Secondary | ICD-10-CM | POA: Diagnosis not present

## 2021-06-16 DIAGNOSIS — F0280 Dementia in other diseases classified elsewhere without behavioral disturbance: Secondary | ICD-10-CM | POA: Diagnosis not present

## 2021-06-20 DIAGNOSIS — F0280 Dementia in other diseases classified elsewhere without behavioral disturbance: Secondary | ICD-10-CM | POA: Diagnosis not present

## 2021-06-20 DIAGNOSIS — E46 Unspecified protein-calorie malnutrition: Secondary | ICD-10-CM | POA: Diagnosis not present

## 2021-06-20 DIAGNOSIS — G309 Alzheimer's disease, unspecified: Secondary | ICD-10-CM | POA: Diagnosis not present

## 2021-06-20 DIAGNOSIS — R634 Abnormal weight loss: Secondary | ICD-10-CM | POA: Diagnosis not present

## 2021-06-20 DIAGNOSIS — Z681 Body mass index (BMI) 19 or less, adult: Secondary | ICD-10-CM | POA: Diagnosis not present

## 2021-06-20 DIAGNOSIS — I482 Chronic atrial fibrillation, unspecified: Secondary | ICD-10-CM | POA: Diagnosis not present

## 2021-06-20 DIAGNOSIS — Z8719 Personal history of other diseases of the digestive system: Secondary | ICD-10-CM | POA: Diagnosis not present

## 2021-06-20 DIAGNOSIS — I119 Hypertensive heart disease without heart failure: Secondary | ICD-10-CM | POA: Diagnosis not present

## 2021-06-21 DIAGNOSIS — Z681 Body mass index (BMI) 19 or less, adult: Secondary | ICD-10-CM | POA: Diagnosis not present

## 2021-06-21 DIAGNOSIS — E46 Unspecified protein-calorie malnutrition: Secondary | ICD-10-CM | POA: Diagnosis not present

## 2021-06-21 DIAGNOSIS — I119 Hypertensive heart disease without heart failure: Secondary | ICD-10-CM | POA: Diagnosis not present

## 2021-06-21 DIAGNOSIS — G309 Alzheimer's disease, unspecified: Secondary | ICD-10-CM | POA: Diagnosis not present

## 2021-06-21 DIAGNOSIS — R634 Abnormal weight loss: Secondary | ICD-10-CM | POA: Diagnosis not present

## 2021-06-21 DIAGNOSIS — F0280 Dementia in other diseases classified elsewhere without behavioral disturbance: Secondary | ICD-10-CM | POA: Diagnosis not present

## 2021-06-23 DIAGNOSIS — F0280 Dementia in other diseases classified elsewhere without behavioral disturbance: Secondary | ICD-10-CM | POA: Diagnosis not present

## 2021-06-23 DIAGNOSIS — Z681 Body mass index (BMI) 19 or less, adult: Secondary | ICD-10-CM | POA: Diagnosis not present

## 2021-06-23 DIAGNOSIS — I119 Hypertensive heart disease without heart failure: Secondary | ICD-10-CM | POA: Diagnosis not present

## 2021-06-23 DIAGNOSIS — G309 Alzheimer's disease, unspecified: Secondary | ICD-10-CM | POA: Diagnosis not present

## 2021-06-23 DIAGNOSIS — E46 Unspecified protein-calorie malnutrition: Secondary | ICD-10-CM | POA: Diagnosis not present

## 2021-06-23 DIAGNOSIS — R634 Abnormal weight loss: Secondary | ICD-10-CM | POA: Diagnosis not present

## 2021-06-26 ENCOUNTER — Emergency Department (HOSPITAL_BASED_OUTPATIENT_CLINIC_OR_DEPARTMENT_OTHER)

## 2021-06-26 ENCOUNTER — Encounter (HOSPITAL_BASED_OUTPATIENT_CLINIC_OR_DEPARTMENT_OTHER): Payer: Self-pay

## 2021-06-26 ENCOUNTER — Other Ambulatory Visit: Payer: Self-pay

## 2021-06-26 ENCOUNTER — Inpatient Hospital Stay (HOSPITAL_BASED_OUTPATIENT_CLINIC_OR_DEPARTMENT_OTHER)
Admission: EM | Admit: 2021-06-26 | Discharge: 2021-06-28 | DRG: 086 | Disposition: A | Discharge status: Hospice | Attending: Internal Medicine | Admitting: Internal Medicine

## 2021-06-26 DIAGNOSIS — Y9301 Activity, walking, marching and hiking: Secondary | ICD-10-CM | POA: Diagnosis not present

## 2021-06-26 DIAGNOSIS — H9190 Unspecified hearing loss, unspecified ear: Secondary | ICD-10-CM | POA: Diagnosis not present

## 2021-06-26 DIAGNOSIS — I480 Paroxysmal atrial fibrillation: Secondary | ICD-10-CM | POA: Diagnosis not present

## 2021-06-26 DIAGNOSIS — G309 Alzheimer's disease, unspecified: Secondary | ICD-10-CM | POA: Diagnosis not present

## 2021-06-26 DIAGNOSIS — Y92099 Unspecified place in other non-institutional residence as the place of occurrence of the external cause: Secondary | ICD-10-CM

## 2021-06-26 DIAGNOSIS — S01112A Laceration without foreign body of left eyelid and periocular area, initial encounter: Secondary | ICD-10-CM | POA: Diagnosis not present

## 2021-06-26 DIAGNOSIS — Z79899 Other long term (current) drug therapy: Secondary | ICD-10-CM

## 2021-06-26 DIAGNOSIS — S065X0A Traumatic subdural hemorrhage without loss of consciousness, initial encounter: Secondary | ICD-10-CM | POA: Diagnosis not present

## 2021-06-26 DIAGNOSIS — I10 Essential (primary) hypertension: Secondary | ICD-10-CM | POA: Diagnosis present

## 2021-06-26 DIAGNOSIS — S065XAA Traumatic subdural hemorrhage with loss of consciousness status unknown, initial encounter: Secondary | ICD-10-CM | POA: Diagnosis present

## 2021-06-26 DIAGNOSIS — Z66 Do not resuscitate: Secondary | ICD-10-CM | POA: Diagnosis present

## 2021-06-26 DIAGNOSIS — F0280 Dementia in other diseases classified elsewhere without behavioral disturbance: Secondary | ICD-10-CM | POA: Diagnosis not present

## 2021-06-26 DIAGNOSIS — Z20822 Contact with and (suspected) exposure to covid-19: Secondary | ICD-10-CM | POA: Diagnosis not present

## 2021-06-26 DIAGNOSIS — R54 Age-related physical debility: Secondary | ICD-10-CM | POA: Diagnosis present

## 2021-06-26 DIAGNOSIS — W1830XA Fall on same level, unspecified, initial encounter: Secondary | ICD-10-CM | POA: Diagnosis present

## 2021-06-26 DIAGNOSIS — F02818 Dementia in other diseases classified elsewhere, unspecified severity, with other behavioral disturbance: Secondary | ICD-10-CM | POA: Diagnosis present

## 2021-06-26 DIAGNOSIS — M47812 Spondylosis without myelopathy or radiculopathy, cervical region: Secondary | ICD-10-CM | POA: Diagnosis not present

## 2021-06-26 DIAGNOSIS — R1310 Dysphagia, unspecified: Secondary | ICD-10-CM | POA: Diagnosis not present

## 2021-06-26 DIAGNOSIS — Z043 Encounter for examination and observation following other accident: Secondary | ICD-10-CM | POA: Diagnosis not present

## 2021-06-26 DIAGNOSIS — I160 Hypertensive urgency: Secondary | ICD-10-CM | POA: Diagnosis not present

## 2021-06-26 DIAGNOSIS — W19XXXA Unspecified fall, initial encounter: Secondary | ICD-10-CM | POA: Diagnosis not present

## 2021-06-26 DIAGNOSIS — I119 Hypertensive heart disease without heart failure: Secondary | ICD-10-CM | POA: Diagnosis not present

## 2021-06-26 DIAGNOSIS — E46 Unspecified protein-calorie malnutrition: Secondary | ICD-10-CM | POA: Diagnosis not present

## 2021-06-26 DIAGNOSIS — Z681 Body mass index (BMI) 19 or less, adult: Secondary | ICD-10-CM | POA: Diagnosis not present

## 2021-06-26 DIAGNOSIS — R634 Abnormal weight loss: Secondary | ICD-10-CM | POA: Diagnosis not present

## 2021-06-26 DIAGNOSIS — S0993XA Unspecified injury of face, initial encounter: Secondary | ICD-10-CM | POA: Diagnosis not present

## 2021-06-26 HISTORY — DX: Unspecified diastolic (congestive) heart failure: I50.30

## 2021-06-26 HISTORY — DX: Dementia in other diseases classified elsewhere, unspecified severity, without behavioral disturbance, psychotic disturbance, mood disturbance, and anxiety: F02.80

## 2021-06-26 LAB — RESP PANEL BY RT-PCR (FLU A&B, COVID) ARPGX2
Influenza A by PCR: NEGATIVE
Influenza B by PCR: NEGATIVE
SARS Coronavirus 2 by RT PCR: NEGATIVE

## 2021-06-26 MED ORDER — HYDRALAZINE HCL 25 MG PO TABS: 25.0000 mg | ORAL_TABLET | Freq: Four times a day (QID) | ORAL | Status: DC | PRN

## 2021-06-26 NOTE — ED Provider Notes (Signed)
Care assumed from Dr. Dina Rich.   At time of transfer of care, radiology had just called to report that she has a 1 cm subdural on the left side from the fall.  I first called the patient's son who agreed that they do not want any procedures done.  They requested I call the hospice team to discuss a plan of care.  They confirm she is DNR/DNI and wants minimal work-up done if possible.  Will call hospice and then neurosurgery if needed.  Wound has been repaired by previous team and cervical collar was removed after CT did not show neck injury.  Patient is resting and moving all extremities and on my brief exam pupils are symmetric and reactive bilaterally.  Hospice was called initially and they said they would get in contact with their representative and hospital liaison to discuss disposition.  Initially plan was that they were in a come here physically and help guide management  11:53 AM Spoke to hospice team again speaking with Ms. Alford Highland at [phone] and she agrees that the patient with a new large subdural would not be stable to be at her memory care facility but there is not a bed available at [facility] inpatient today.  She recommends admission for comfort and await a [facility] bed over the next 24-48 hours.  Will hold on blood work as family reports she is acting similar to her baseline and they requested minimizing discomfort.  Will call medicine for admission.   Warrick Llera, Gwenyth Allegra, MD 06/26/21 1517

## 2021-06-26 NOTE — Progress Notes (Signed)
Manufacturing engineer [facility]) Hospital Liaison note.     This is a current [facility] hospice patient from [facility] with a hospice diagnosis of Abnormal weight loss and Alzheimer disease. [facility] hospital liaisons will follow for care coordination and disposition.  Please call with any hospice related questions.   Thank you,     Farrel Gordon, RN, [place] Hospital Liaison  [phone]

## 2021-06-26 NOTE — ED Notes (Signed)
Changed patient's brief/patient incontinent of urine.

## 2021-06-26 NOTE — ED Notes (Signed)
Patient transported to CT 

## 2021-06-26 NOTE — ED Triage Notes (Signed)
Pt BIBA for an unwitnessed fall. Staff members found patient down after screaming at appx 0300 while possibly ambulating to her bed. They tried to stop the bleed and wanted her to be further evaluated for the laceration of the left eyebrow. Denies any pain. Per EMS patient is at baseline with Alzheimers. HOH. EMS placed patient in c-collar 'per protocol'. Not on bloodthinners. DNR.

## 2021-06-26 NOTE — ED Provider Notes (Addendum)
[facility] EMERGENCY DEPT Provider Note   CSN: [id number] Arrival date & time: 06/26/21  1779     History Chief Complaint  Patient presents with   Fall   Laceration    Dawn Buchanan is a 87 y.o. female.  HPI     This is a 87 year old female with reported history of dementia and hypertension who presents following a fall.  Per EMS, facility found the patient on the floor around 3 AM after she was found yelling.  She had a noted injury to the left eyebrow.  She presumably fell while walking to the bathroom.  This was unwitnessed.  They were unable to get bleeding to stop.  She is not on any report of blood thinners.  She has a history of dementia and she is very hard of hearing.  She reportedly is at her baseline.  Level 5 caveat for dementia  Past Medical History:  Diagnosis Date   Alzheimer disease ([place])    Dementia ([place])    Hypertension     Patient Active Problem List   Diagnosis Date Noted   Diverticulitis 01/12/2020   AF (paroxysmal atrial fibrillation) ([place]) 01/12/2020   Dementia without behavioral disturbance ([place]) 01/12/2020   Chronic diastolic CHF (congestive heart failure) ([place]) 01/12/2020    History reviewed. No pertinent surgical history.   OB History   No obstetric history on file.     No family history on file.  Social History   Tobacco Use   Smoking status: Never   Smokeless tobacco: Never    Home Medications Prior to Admission medications   Medication Sig Start Date End Date Taking? Authorizing Provider  acetaminophen (TYLENOL) 500 MG tablet Take 500 mg by mouth 3 (three) times daily.    [provider]  diltiazem (CARDIZEM CD) 120 MG 24 hr capsule Take 1 capsule (120 mg total) by mouth daily. 01/14/20   Mercy Riding, MD  memantine (NAMENDA XR) 28 MG CP24 24 hr capsule Take 1 capsule by mouth daily.    [provider]  Mineral Oil OIL Place 2 drops into both ears every Friday.     [provider]   traMADol (ULTRAM) 50 MG tablet Take 50 mg by mouth every 8 (eight) hours as needed for moderate pain.    [provider]  traMADol (ULTRAM) 50 MG tablet Take 1 tablet (50 mg total) by mouth every 6 (six) hours as needed for moderate pain. 04/29/20   Charlesetta Shanks, MD    Allergies    Patient has no known allergies.  Review of Systems   Review of Systems  Unable to perform ROS: Dementia   Physical Exam Updated Vital Signs BP (!) 161/97 (BP Location: Left Arm)   Pulse 90   Temp 97.7 F (36.5 C) (Axillary)   Resp 19   Ht 1.6 m (5\' 3" )   Wt 48.4 kg   SpO2 98%   BMI 18.90 kg/m   Physical Exam Vitals and nursing note reviewed.  Constitutional:      Appearance: She is well-developed.     Comments: ABCs intact  HENT:     Head: Normocephalic.     Comments: 2 cm deep laceration over the lateral aspect of the left eyebrow, oozing noted, underlying hematoma, no bony deformities noted    Nose: Nose normal.     Mouth/Throat:     Mouth: Mucous membranes are moist.  Eyes:     Pupils: Pupils are equal, round, and reactive to light.  Cardiovascular:     Rate and Rhythm: Normal rate and regular rhythm.     Heart sounds: Normal heart sounds.  Pulmonary:     Effort: Pulmonary effort is normal. No respiratory distress.     Breath sounds: No wheezing.  Abdominal:     Palpations: Abdomen is soft.  Musculoskeletal:        General: No tenderness or deformity. Normal range of motion.     Cervical back: Neck supple.     Right lower leg: No edema.     Left lower leg: No edema.     Comments: Full range of motion of the bilateral hips and knees, no obvious deformities  Skin:    General: Skin is warm and dry.  Neurological:     Mental Status: She is alert.     Comments: Disoriented, moves all 4 extremities  Psychiatric:     Comments: Unable to assess    ED Results / Procedures / Treatments   Labs (all labs ordered are listed, but only abnormal results are displayed) Labs  Reviewed - No data to display  EKG None  Radiology CT Head Wo Contrast  Result Date: 06/26/2021 CLINICAL DATA:  Unwitnessed fall EXAM: CT HEAD WITHOUT CONTRAST CT CERVICAL SPINE WITHOUT CONTRAST TECHNIQUE: Multidetector CT imaging of the head and cervical spine was performed following the standard protocol without intravenous contrast. Multiplanar CT image reconstructions of the cervical spine were also generated. COMPARISON:  Prior CT scan of the head 04/29/2020 FINDINGS: CT HEAD FINDINGS Brain: High attenuation material present layering over the left cerebral hemisphere consistent with acute subdural hematoma. The hematoma measures up to 1 cm in width. No significant midline shift. Cortical and central atrophy. Ex vacuo dilation of the lateral ventricles. No evidence of hydrocephalus. Lacunar infarct in the bilateral cerebellar hemispheres. Moderate periventricular white matter hypoattenuation consistent with chronic microvascular ischemic white matter disease. Left periorbital soft tissue swelling with skin staples in place. Vascular: No hyperdense vessel or unexpected calcification. Skull: Normal. Negative for fracture or focal lesion. Sinuses/Orbits: No acute finding. Other: Left periorbital soft tissue swelling and laceration with skin staples in place. CT CERVICAL SPINE FINDINGS Alignment: Mildly accentuated cervical lordosis. No acute malalignment or subluxation. Skull base and vertebrae: No acute fracture. No primary bone lesion or focal pathologic process. Soft tissues and spinal canal: No prevertebral fluid or swelling. No visible canal hematoma. Disc levels: Mild multilevel cervical spondylosis. Degenerative changes most significant at C5-C6 were there is loss of disc space height and posterior disc osteophyte complex formation. Mild bilateral facet arthropathy. Upper chest: Negative. Other: None. IMPRESSION: CT HEAD 1. Positive for acute subdural intracranial hemorrhage overlying the left  hemisphere. The subdural hematoma measures up to 1 cm in width. No significant midline shift. 2. Chronic atrophy, ex vacuo ventriculomegaly, cerebellar lacunar infarcts and moderately advanced chronic microvascular ischemic white matter disease. 3. Left periorbital soft tissue contusion and laceration with skin staples in place. No evidence of underlying calvarial fracture. CT CSPINE 1. No acute fracture or malalignment. 2. Exaggerated cervical lordosis likely positional in nature. 3. Mild cervical spondylosis and facet arthropathy. Critical Value/emergent results were called by telephone at the time of interpretation on 06/26/2021 at 7:01 am to provider [facility] , who verbally acknowledged these results. Electronically Signed   By: Jacqulynn Cadet M.D.   On: 06/26/2021 07:03   CT Cervical Spine Wo Contrast  Result Date: 06/26/2021 CLINICAL DATA:  Unwitnessed fall EXAM: CT HEAD WITHOUT CONTRAST CT CERVICAL SPINE WITHOUT CONTRAST  TECHNIQUE: Multidetector CT imaging of the head and cervical spine was performed following the standard protocol without intravenous contrast. Multiplanar CT image reconstructions of the cervical spine were also generated. COMPARISON:  Prior CT scan of the head 04/29/2020 FINDINGS: CT HEAD FINDINGS Brain: High attenuation material present layering over the left cerebral hemisphere consistent with acute subdural hematoma. The hematoma measures up to 1 cm in width. No significant midline shift. Cortical and central atrophy. Ex vacuo dilation of the lateral ventricles. No evidence of hydrocephalus. Lacunar infarct in the bilateral cerebellar hemispheres. Moderate periventricular white matter hypoattenuation consistent with chronic microvascular ischemic white matter disease. Left periorbital soft tissue swelling with skin staples in place. Vascular: No hyperdense vessel or unexpected calcification. Skull: Normal. Negative for fracture or focal lesion. Sinuses/Orbits: No acute  finding. Other: Left periorbital soft tissue swelling and laceration with skin staples in place. CT CERVICAL SPINE FINDINGS Alignment: Mildly accentuated cervical lordosis. No acute malalignment or subluxation. Skull base and vertebrae: No acute fracture. No primary bone lesion or focal pathologic process. Soft tissues and spinal canal: No prevertebral fluid or swelling. No visible canal hematoma. Disc levels: Mild multilevel cervical spondylosis. Degenerative changes most significant at C5-C6 were there is loss of disc space height and posterior disc osteophyte complex formation. Mild bilateral facet arthropathy. Upper chest: Negative. Other: None. IMPRESSION: CT HEAD 1. Positive for acute subdural intracranial hemorrhage overlying the left hemisphere. The subdural hematoma measures up to 1 cm in width. No significant midline shift. 2. Chronic atrophy, ex vacuo ventriculomegaly, cerebellar lacunar infarcts and moderately advanced chronic microvascular ischemic white matter disease. 3. Left periorbital soft tissue contusion and laceration with skin staples in place. No evidence of underlying calvarial fracture. CT CSPINE 1. No acute fracture or malalignment. 2. Exaggerated cervical lordosis likely positional in nature. 3. Mild cervical spondylosis and facet arthropathy. Critical Value/emergent results were called by telephone at the time of interpretation on 06/26/2021 at 7:01 am to provider [facility] , who verbally acknowledged these results. Electronically Signed   By: Jacqulynn Cadet M.D.   On: 06/26/2021 07:03    Procedures .Marland KitchenLaceration Repair  Date/Time: 06/26/2021 6:32 AM Performed by: Merryl Hacker, MD Authorized by: Merryl Hacker, MD   Consent:    Consent obtained:  Emergent situation   Risks discussed:  Poor cosmetic result, poor wound healing and pain   Alternatives discussed:  No treatment Anesthesia:    Anesthesia method:  None Laceration details:    Location:  Face    Face location:  L eyebrow   Length (cm):  2   Depth (mm):  7 Exploration:    Limited defect created (wound extended): no     Hemostasis achieved with:  Direct pressure   Wound extent: no areolar tissue violation noted, no fascia violation noted, no foreign bodies/material noted, no muscle damage noted, no nerve damage noted, no tendon damage noted, no underlying fracture noted and no vascular damage noted     Contaminated: no   Treatment:    Amount of cleaning:  Standard   Irrigation solution:  Sterile saline   Irrigation method:  Pressure wash   Debridement:  None Skin repair:    Repair method:  Staples   Number of staples:  2 Approximation:    Approximation:  Close Repair type:    Repair type:  Simple Post-procedure details:    Dressing:  Adhesive bandage and antibiotic ointment   Procedure completion:  Tolerated   Medications Ordered in ED Medications - No  data to display  ED Course  I have reviewed the triage vital signs and the nursing notes.  Pertinent labs & imaging results that were available during my care of the patient were reviewed by me and considered in my medical decision making (see chart for details).    MDM Rules/Calculators/A&P                           Patient presents after an unwitnessed fall.  Obvious injury to the left eyebrow.  She is nontoxic and vital signs are reassuring.  No other obvious injuries.  CT head and neck ordered given her age and unknown mechanism.  While evaluating the patient's laceration, patient did not tolerate any palpation or intensive examination of the wound.  She screamed as it was being cleaned and washed out.  She is not directable.  While not optimal, I felt like she would not stay still or tolerate lidocaine injection.  For this reason, I placed 2 staples to achieve hemostasis.  This is not ideal for cosmetic outcome; however, do not feel that patient would be cooperative for repair otherwise.  A compressive dressing was  applied given underlying hematoma. Patient is DNR and on hospice care.  7:08 AM Awaiting CT reads.  CTs independently reviewed by myself.  SIgnificant  atrophy.  No displaced skull fractures.  Patient signed out to oncoming provider.  7:08 AM Left subdural per radiology.  1 cm. Compensated with significant atrophy.  Dr. Sherry Ruffing to manage neurosurgery consultation.   Final Clinical Impression(s) / ED Diagnoses Final diagnoses:  Laceration of left eyebrow, initial encounter  Subdural hematoma    Rx / DC Orders ED Discharge Orders     None        Dann Galicia, Barbette Hair, MD 06/26/21 [id number]    Merryl Hacker, MD 06/26/21 [id number]    Merryl Hacker, MD 06/26/21 2641    Merryl Hacker, MD 06/26/21 [phone]

## 2021-06-27 ENCOUNTER — Observation Stay (HOSPITAL_COMMUNITY)

## 2021-06-27 ENCOUNTER — Other Ambulatory Visit: Payer: Self-pay

## 2021-06-27 ENCOUNTER — Encounter (HOSPITAL_COMMUNITY): Payer: Self-pay | Admitting: Internal Medicine

## 2021-06-27 DIAGNOSIS — Y92099 Unspecified place in other non-institutional residence as the place of occurrence of the external cause: Secondary | ICD-10-CM | POA: Diagnosis not present

## 2021-06-27 DIAGNOSIS — Z79899 Other long term (current) drug therapy: Secondary | ICD-10-CM | POA: Diagnosis not present

## 2021-06-27 DIAGNOSIS — Z66 Do not resuscitate: Secondary | ICD-10-CM | POA: Diagnosis present

## 2021-06-27 DIAGNOSIS — W1830XA Fall on same level, unspecified, initial encounter: Secondary | ICD-10-CM | POA: Diagnosis present

## 2021-06-27 DIAGNOSIS — I6200 Nontraumatic subdural hemorrhage, unspecified: Secondary | ICD-10-CM | POA: Diagnosis not present

## 2021-06-27 DIAGNOSIS — I160 Hypertensive urgency: Secondary | ICD-10-CM | POA: Diagnosis present

## 2021-06-27 DIAGNOSIS — I10 Essential (primary) hypertension: Secondary | ICD-10-CM | POA: Diagnosis not present

## 2021-06-27 DIAGNOSIS — R54 Age-related physical debility: Secondary | ICD-10-CM | POA: Diagnosis present

## 2021-06-27 DIAGNOSIS — S065XAA Traumatic subdural hemorrhage with loss of consciousness status unknown, initial encounter: Secondary | ICD-10-CM | POA: Diagnosis not present

## 2021-06-27 DIAGNOSIS — Z681 Body mass index (BMI) 19 or less, adult: Secondary | ICD-10-CM | POA: Diagnosis not present

## 2021-06-27 DIAGNOSIS — R279 Unspecified lack of coordination: Secondary | ICD-10-CM | POA: Diagnosis not present

## 2021-06-27 DIAGNOSIS — R1310 Dysphagia, unspecified: Secondary | ICD-10-CM | POA: Diagnosis not present

## 2021-06-27 DIAGNOSIS — W19XXXA Unspecified fall, initial encounter: Secondary | ICD-10-CM | POA: Diagnosis not present

## 2021-06-27 DIAGNOSIS — G309 Alzheimer's disease, unspecified: Secondary | ICD-10-CM

## 2021-06-27 DIAGNOSIS — F0280 Dementia in other diseases classified elsewhere without behavioral disturbance: Secondary | ICD-10-CM

## 2021-06-27 DIAGNOSIS — Z20822 Contact with and (suspected) exposure to covid-19: Secondary | ICD-10-CM | POA: Diagnosis present

## 2021-06-27 DIAGNOSIS — I480 Paroxysmal atrial fibrillation: Secondary | ICD-10-CM

## 2021-06-27 DIAGNOSIS — R634 Abnormal weight loss: Secondary | ICD-10-CM | POA: Diagnosis not present

## 2021-06-27 DIAGNOSIS — E46 Unspecified protein-calorie malnutrition: Secondary | ICD-10-CM | POA: Diagnosis not present

## 2021-06-27 DIAGNOSIS — H9190 Unspecified hearing loss, unspecified ear: Secondary | ICD-10-CM | POA: Diagnosis present

## 2021-06-27 DIAGNOSIS — F02818 Dementia in other diseases classified elsewhere, unspecified severity, with other behavioral disturbance: Secondary | ICD-10-CM | POA: Diagnosis present

## 2021-06-27 DIAGNOSIS — S01112A Laceration without foreign body of left eyelid and periocular area, initial encounter: Secondary | ICD-10-CM | POA: Diagnosis present

## 2021-06-27 DIAGNOSIS — S065X0A Traumatic subdural hemorrhage without loss of consciousness, initial encounter: Secondary | ICD-10-CM | POA: Diagnosis not present

## 2021-06-27 DIAGNOSIS — Z743 Need for continuous supervision: Secondary | ICD-10-CM | POA: Diagnosis not present

## 2021-06-27 DIAGNOSIS — Y9301 Activity, walking, marching and hiking: Secondary | ICD-10-CM | POA: Diagnosis present

## 2021-06-27 DIAGNOSIS — I119 Hypertensive heart disease without heart failure: Secondary | ICD-10-CM | POA: Diagnosis not present

## 2021-06-27 MED ORDER — BOOST / RESOURCE BREEZE PO LIQD CUSTOM
1.0000 | Freq: Three times a day (TID) | ORAL | Status: DC
  Administered 2021-06-27 – 2021-06-28 (×2): 1 via ORAL

## 2021-06-27 MED ORDER — DILTIAZEM HCL ER COATED BEADS 120 MG PO CP24
120.0000 mg | ORAL_CAPSULE | Freq: Every day | ORAL | Status: DC
  Administered 2021-06-27 – 2021-06-28 (×2): 120 mg via ORAL
  Filled 2021-06-27 (×2): qty 1

## 2021-06-27 MED ORDER — GERHARDT'S BUTT CREAM
TOPICAL_CREAM | Freq: Two times a day (BID) | CUTANEOUS | Status: DC
  Filled 2021-06-27: qty 1

## 2021-06-27 MED ORDER — ALBUTEROL SULFATE (2.5 MG/3ML) 0.083% IN NEBU: 2.5000 mg | INHALATION_SOLUTION | Freq: Four times a day (QID) | RESPIRATORY_TRACT | Status: DC | PRN

## 2021-06-27 MED ORDER — ACETAMINOPHEN 650 MG RE SUPP: 650.0000 mg | Freq: Four times a day (QID) | RECTAL | Status: DC | PRN

## 2021-06-27 MED ORDER — ACETAMINOPHEN 325 MG PO TABS
650.0000 mg | ORAL_TABLET | Freq: Four times a day (QID) | ORAL | Status: DC | PRN
  Administered 2021-06-27: 650 mg via ORAL
  Filled 2021-06-27: qty 2

## 2021-06-27 MED ORDER — HYDROCODONE-ACETAMINOPHEN 5-325 MG PO TABS
1.0000 | ORAL_TABLET | Freq: Four times a day (QID) | ORAL | Status: DC | PRN
  Administered 2021-06-28: 1 via ORAL
  Filled 2021-06-27: qty 1

## 2021-06-27 NOTE — Progress Notes (Addendum)
[facility] [id number]  [facility]) Hospital Liaison note.     This is a current [facility] hospice patient from [facility] with a hospice diagnosis of Abnormal weight loss and Alzheimer disease.  Facility activated EMS after find patient down with laceration to forehead. She was transported to [facility] ED for evaluation and was admitted on 06/26/2021 with SDH. Per [facility] physician, Dr. Gildardo Cranker, this is a related hospital admission.   Patient was visited at bedside with son present. She slept through visit. Patient has laceration to left eyebrow with bruising to face and forehead. Patient has previously been agitated and pulled IV out. Currently in mitts. Per son she is at her baseline of orientation. She ate yogurt this morning per her son.  This patient is appropriate for inpatient for assessment and monitoring for head injury.  VS: 97.8, 147/112, 97, 14, 100% RA I/O: -/200  Abnormal labs: none drawn  Diagnostics:  CT Head 11/8 IMPRESSION: Stable left cerebral convexity subdural hematoma. No significant mass effect. No new hemorrhage.  CT head 11/7 1. Positive for acute subdural intracranial hemorrhage overlying the left hemisphere. The subdural hematoma measures up to 1 cm in width. No significant midline shift. 2. Chronic atrophy, ex vacuo ventriculomegaly, cerebellar lacunar infarcts and moderately advanced chronic microvascular ischemic white matter disease. 3. Left periorbital soft tissue contusion and laceration with skin staples in place. No evidence of underlying calvarial fracture.   CT CSPINE   1. No acute fracture or malalignment. 2. Exaggerated cervical lordosis likely positional in nature. 3. Mild cervical spondylosis and facet arthropathy  IV/PRN meds: None. IV was pulled out by patient and not replaced.  Problem list: Assessment/Plan Subdural hematoma secondary to fall: Patient had an unwitnessed fall sustaining a laceration to her left brow.   Found to have acute subdural hematoma of the left hemisphere measuring approximately 1 cm in width.  -Admit to a MedSurg bed -Neuro check -Recheck CT scan of the head -Hydrocodone as needed for pain   Alzheimer's dementia with behavioral disturbance: Patient was noted to be confused and restless overnight and had pulled out her IV. -Set bed alarm on -Safety sitter due to increased risk of fall -Continue mittens -Orders for physical therapy discontinued   Hypertensive urgency: Acute.  On admission blood pressure elevated up to 188/98.  Home medication regimen includes Cardizem CD 120 mg daily. -Hydralazine as needed for elevated blood pressure greater than 180   Paroxysmal atrial fibrillation: Patient was not on anticoagulation likely due risk of falls. -Continue Cardizem   Dysphagia -Mechanically soft diet with assistance   DNR hospice care: Prior to arrival.  Family had requested to minimize patient discomfort. -Palliative care consult   Discharge planning:  To be determined. Family would like for her to transition to [facility] if appropriate  Family Contact: Spoke with son at bedside.  IDG: Team updated.   Goals of care: Clear. DNR and full comfort care.  Should patient need ambulance transport please use [facility] as this service is contracted for all active [facility] hospice patients.  A Please do not hesitate to call with questions.   Thank you,   Farrel Gordon, RN, [facility] Liaison   [phone]

## 2021-06-27 NOTE — TOC Initial Note (Addendum)
Transition of Care [facility]) - Initial/Assessment Note    Patient Details  Name: Dawn Buchanan MRN: [id number] Date of Birth: 1928-03-29  [facility] CM/SW Contact:    Carles Collet, RN Phone Number: 06/27/2021, 10:37 AM  Clinical Narrative:            Thomas Hoff patient's son [name], who confirms that patient is admitted from [facility] and is followed by [facility] for hospice care. He states that he understands that the patient would likely not be able to return to level of care at ALF. He is interested in [facility] for inpatient hospice care. We reviewed basic admission and life expectancy criteria. He voices that he is also concerned about cost if patient were to DC to SNF w hospice care. Spoke w [place], admission liaison for [facility], who will call me back later when available to talk.  [place], [place] [phone]         11:00 Spoke w Ihor Dow, she will begin assessment for [facility] referral.   16:00 Spoke w Latanya Presser of [facility], who states that upon review w their MD, patient does not meet criteria for inpatient hospice admission with [facility]. Spoke w patient's son and he will be at hospital this evening to assess how he feels patient will do returning to ALF.   Expected Discharge Plan: [place] Barriers to Discharge: Continued Medical Work up   Patient Goals and CMS Choice Patient states their goals for this hospitalization and ongoing recovery are:: to go to [facility] for hospice care CMS Medicare.gov Compare Post Acute Care list provided to:: Other (Comment Required) Choice offered to / list presented to : Adult Children  Expected Discharge Plan and Services Expected Discharge Plan: [place] In-house Referral: Clinical Social Work Discharge Planning Services: CM Consult   Living arrangements for the past 2 months: [place]                                      Prior Living  Arrangements/Services Living arrangements for the past 2 months: [place]                     Activities of Daily Living Home Assistive Devices/Equipment: Environmental consultant (specify type) ADL Screening (condition at time of admission) Patient's cognitive ability adequate to safely complete daily activities?: No Is the patient deaf or have difficulty hearing?: No Does the patient have difficulty seeing, even when wearing glasses/contacts?: Yes Does the patient have difficulty concentrating, remembering, or making decisions?: Yes Patient able to express need for assistance with ADLs?: No Does the patient have difficulty dressing or bathing?: Yes Independently performs ADLs?: No Communication: Needs assistance Is this a change from baseline?: Pre-admission baseline Dressing (OT): Needs assistance Is this a change from baseline?: Pre-admission baseline Grooming: Needs assistance Is this a change from baseline?: Pre-admission baseline Feeding: Needs assistance Is this a change from baseline?: Pre-admission baseline Bathing: Needs assistance Is this a change from baseline?: Pre-admission baseline Does the patient have difficulty walking or climbing stairs?: Yes Weakness of Legs: Both Weakness of Arms/Hands: Both  Permission Sought/Granted                  Emotional Assessment              Admission diagnosis:  Subdural hematoma [S06.5XAA] Laceration of left eyebrow, initial encounter [P29.518A]  Fall, initial encounter [W19.XXXA] Patient Active Problem List   Diagnosis Date Noted   Hypertension 06/27/2021   Subdural hematoma 06/26/2021   Diverticulitis 01/12/2020   AF (paroxysmal atrial fibrillation) ([place]) 01/12/2020   Alzheimer disease ([place]) 01/12/2020   Chronic diastolic CHF (congestive heart failure) ([place]) 01/12/2020   PCP:  Lajean Manes, MD Pharmacy:  No Pharmacies Listed    Social Determinants of Health (SDOH) Interventions    Readmission  Risk Interventions No flowsheet data found.

## 2021-06-27 NOTE — Progress Notes (Signed)
Pt settled down, and has rested with eyes closed no distress noted.

## 2021-06-27 NOTE — H&P (Addendum)
History and Physical    Dawn Buchanan GYJ:[id number] DOB: 12/22/1927 DOA: 06/26/2021  Referring MD/NP/PA: Fayette Pho, MD PCP: Lajean Manes, MD  Patient coming from: From [facility] care transferred from [place]: Fall  I have personally briefly reviewed patient's old medical records in [place]   HPI: Dawn Buchanan is a 87 y.o. female with medical history significant of Alzheimer's dementia, hypertension, paroxysmal atrial fibrillation not on anticoagulation, diastolic CHF last EF 55 -26% with grade 3 diastolic dysfunction, and currently on hospice who presents after having an unwitnessed fall.  History is obtained from review of records and talks with her son who is present at bedside.  She was found down after staff members heard the patient screaming at approximately 3 AM yesterday morning.  She apparently hit something as she fell as she sustained a laceration to her left brow.  At baseline patient ambulates with assistance of another person and has very poor memory.  Her son feels that she may recognize him approximately 50% of the time, but speaks in "word salad" and has been doing so for approximately 2 years now.  She had been placed on hospice back in July after patient lost 15 to 20 pounds in the last year.  However, he notes that her weight has been stable since that time.  Where she is staying at the memory care unit they do not allow for restraints and approximately every 6 months or so the patient tries to get up in the middle night and has a fall.  He reiterates that they do not want any aggressive therapies or interventions as this would likely agitate the patient.  She is also supposed to be on a mechanically soft diet due to her not wearing her dentures regularly.  ED Course: Upon admission to the emergency room patient was seen to be afebrile, pulse 68-101, blood pressure elevated to 188/98, and all other vital signs maintained.  CT scan of  the head significant for an acute subdural hematoma hematoma of the left hemisphere measuring approximately 1 cm in width.  Laceration was repaired with 2 staples.  Hospice was notified, but felt not stable enough to return to her memory care unit and bed at [facility] was not currently available.  Blood work was held as family wanted to minimize patient discomfort.  TRH consulted for observation admission.  Review of Systems  Unable to perform ROS: Dementia   Past Medical History:  Diagnosis Date   Alzheimer disease ([place])    Dementia ([place])    Hypertension     History reviewed. No pertinent surgical history.   reports that she has never smoked. She has never used smokeless tobacco. No history on file for alcohol use and drug use.  No Known Allergies  No family history on file.  Prior to Admission medications   Medication Sig Start Date End Date Taking? Authorizing Provider  diltiazem (CARDIZEM CD) 120 MG 24 hr capsule Take 1 capsule (120 mg total) by mouth daily. 01/14/20  Yes Mercy Riding, MD  loperamide (IMODIUM A-D) 2 MG tablet Take 2 mg by mouth every 6 (six) hours as needed for diarrhea or loose stools.   Yes [provider]  Mineral Oil OIL Place 2 drops into both ears every Friday.    Yes [provider]  Nutritional Supplement LIQD Take 1 Dose by mouth See admin instructions. After each meal - Mighty shake   Yes [provider]  traMADol (  ULTRAM) 50 MG tablet Take 1 tablet (50 mg total) by mouth every 6 (six) hours as needed for moderate pain. Patient not taking: No sig reported 04/29/20   Charlesetta Shanks, MD    Physical Exam:  Constitutional: Frail elderly female currently in no acute distress Vitals:   06/26/21 1842 06/26/21 2319 06/27/21 0337 06/27/21 0735  BP: (!) 168/115 (!) 150/100 (!) 155/91 (!) 164/113  Pulse: 97 84 85 76  Resp: 14 16 16 14   Temp: 97.7 F (36.5 C) 98.2 F (36.8 C) 98.1 F (36.7 C) 98.2 F (36.8 C)  TempSrc: Oral  Oral Oral Oral  SpO2: 100% 100% 100% 100%  Weight:      Height:       Eyes: PERRL, bruising around the left eye.  Laceration of left brow bandaged. ENMT: Mucous membranes are moist. Edentulous Neck: normal, supple, no masses, no thyromegaly Respiratory: clear to auscultation bilaterally, no wheezing, no crackles. Normal respiratory effort. No accessory muscle use.  Cardiovascular: Irregular rhythm 2+ pedal pulses. No carotid bruits.  Abdomen: no tenderness, no masses palpated.Bowel sounds positive.  Musculoskeletal: no clubbing / cyanosis.  Mitts currently on both hands. Skin: Bruising as noted above Neurologic: CN 2-12 grossly intact.  Able to move all extremities Psychiatric: Dementia, unable to assess orientation due to word salad.    Labs on Admission: I have personally reviewed following labs and imaging studies  CBC: No results for input(s): WBC, NEUTROABS, HGB, HCT, MCV, PLT in the last 168 hours. Basic Metabolic Panel: No results for input(s): NA, K, CL, CO2, GLUCOSE, BUN, CREATININE, CALCIUM, MG, PHOS in the last 168 hours. GFR: CrCl cannot be calculated (Patient's most recent lab result is older than the maximum 21 days allowed.). Liver Function Tests: No results for input(s): AST, ALT, ALKPHOS, BILITOT, PROT, ALBUMIN in the last 168 hours. No results for input(s): LIPASE, AMYLASE in the last 168 hours. No results for input(s): AMMONIA in the last 168 hours. Coagulation Profile: No results for input(s): INR, PROTIME in the last 168 hours. Cardiac Enzymes: No results for input(s): CKTOTAL, CKMB, CKMBINDEX, TROPONINI in the last 168 hours. BNP (last 3 results) No results for input(s): PROBNP in the last 8760 hours. HbA1C: No results for input(s): HGBA1C in the last 72 hours. CBG: No results for input(s): GLUCAP in the last 168 hours. Lipid Profile: No results for input(s): CHOL, HDL, LDLCALC, TRIG, CHOLHDL, LDLDIRECT in the last 72 hours. Thyroid Function Tests: No  results for input(s): TSH, T4TOTAL, FREET4, T3FREE, THYROIDAB in the last 72 hours. Anemia Panel: No results for input(s): VITAMINB12, FOLATE, FERRITIN, TIBC, IRON, RETICCTPCT in the last 72 hours. Urine analysis:    Component Value Date/Time   COLORURINE YELLOW 04/18/2020 0148   APPEARANCEUR CLEAR 04/18/2020 0148   LABSPEC 1.009 04/18/2020 0148   PHURINE 7.0 04/18/2020 0148   GLUCOSEU NEGATIVE 04/18/2020 0148   HGBUR NEGATIVE 04/18/2020 0148   BILIRUBINUR NEGATIVE 04/18/2020 0148   KETONESUR NEGATIVE 04/18/2020 0148   PROTEINUR NEGATIVE 04/18/2020 0148   NITRITE NEGATIVE 04/18/2020 0148   LEUKOCYTESUR NEGATIVE 04/18/2020 0148   Sepsis Labs: Recent Results (from the past 240 hour(s))  Resp Panel by RT-PCR (Flu A&B, Covid) Nasopharyngeal Swab     Status: None   Collection Time: 06/26/21 12:18 PM   Specimen: Nasopharyngeal Swab; Nasopharyngeal(NP) swabs in vial transport medium  Result Value Ref Range Status   SARS Coronavirus 2 by RT PCR NEGATIVE NEGATIVE Final    Comment: (NOTE) SARS-CoV-2 target nucleic acids are NOT DETECTED.  The SARS-CoV-2 RNA is generally detectable in upper respiratory specimens during the acute phase of infection. The lowest concentration of SARS-CoV-2 viral copies this assay can detect is 138 copies/mL. A negative result does not preclude SARS-Cov-2 infection and should not be used as the sole basis for treatment or other patient management decisions. A negative result may occur with  improper specimen collection/handling, submission of specimen other than nasopharyngeal swab, presence of viral mutation(s) within the areas targeted by this assay, and inadequate number of viral copies(<138 copies/mL). A negative result must be combined with clinical observations, patient history, and epidemiological information. The expected result is Negative.  Fact Sheet for Patients:  EntrepreneurPulse.com.au  Fact Sheet for Healthcare Providers:   IncredibleEmployment.be  This test is no t yet approved or cleared by the [place] FDA and  has been authorized for detection and/or diagnosis of SARS-CoV-2 by FDA under an Emergency Use Authorization (EUA). This EUA will remain  in effect (meaning this test can be used) for the duration of the COVID-19 declaration under Section 564(b)(1) of the Act, 21 U.S.C.section 360bbb-3(b)(1), unless the authorization is terminated  or revoked sooner.       Influenza A by PCR NEGATIVE NEGATIVE Final   Influenza B by PCR NEGATIVE NEGATIVE Final    Comment: (NOTE) The Xpert Xpress SARS-CoV-2/FLU/RSV plus assay is intended as an aid in the diagnosis of influenza from Nasopharyngeal swab specimens and should not be used as a sole basis for treatment. Nasal washings and aspirates are unacceptable for Xpert Xpress SARS-CoV-2/FLU/RSV testing.  Fact Sheet for Patients: EntrepreneurPulse.com.au  Fact Sheet for Healthcare Providers: IncredibleEmployment.be  This test is not yet approved or cleared by the [place] FDA and has been authorized for detection and/or diagnosis of SARS-CoV-2 by FDA under an Emergency Use Authorization (EUA). This EUA will remain in effect (meaning this test can be used) for the duration of the COVID-19 declaration under Section 564(b)(1) of the Act, 21 U.S.C. section 360bbb-3(b)(1), unless the authorization is terminated or revoked.  Performed at [facility], [address], [place], [place] [address]      Radiological Exams on Admission: CT Head Wo Contrast  Result Date: 06/26/2021 CLINICAL DATA:  Unwitnessed fall EXAM: CT HEAD WITHOUT CONTRAST CT CERVICAL SPINE WITHOUT CONTRAST TECHNIQUE: Multidetector CT imaging of the head and cervical spine was performed following the standard protocol without intravenous contrast. Multiplanar CT image reconstructions of the cervical spine were  also generated. COMPARISON:  Prior CT scan of the head 04/29/2020 FINDINGS: CT HEAD FINDINGS Brain: High attenuation material present layering over the left cerebral hemisphere consistent with acute subdural hematoma. The hematoma measures up to 1 cm in width. No significant midline shift. Cortical and central atrophy. Ex vacuo dilation of the lateral ventricles. No evidence of hydrocephalus. Lacunar infarct in the bilateral cerebellar hemispheres. Moderate periventricular white matter hypoattenuation consistent with chronic microvascular ischemic white matter disease. Left periorbital soft tissue swelling with skin staples in place. Vascular: No hyperdense vessel or unexpected calcification. Skull: Normal. Negative for fracture or focal lesion. Sinuses/Orbits: No acute finding. Other: Left periorbital soft tissue swelling and laceration with skin staples in place. CT CERVICAL SPINE FINDINGS Alignment: Mildly accentuated cervical lordosis. No acute malalignment or subluxation. Skull base and vertebrae: No acute fracture. No primary bone lesion or focal pathologic process. Soft tissues and spinal canal: No prevertebral fluid or swelling. No visible canal hematoma. Disc levels: Mild multilevel cervical spondylosis. Degenerative changes most significant at C5-C6 were there is loss  of disc space height and posterior disc osteophyte complex formation. Mild bilateral facet arthropathy. Upper chest: Negative. Other: None. IMPRESSION: CT HEAD 1. Positive for acute subdural intracranial hemorrhage overlying the left hemisphere. The subdural hematoma measures up to 1 cm in width. No significant midline shift. 2. Chronic atrophy, ex vacuo ventriculomegaly, cerebellar lacunar infarcts and moderately advanced chronic microvascular ischemic white matter disease. 3. Left periorbital soft tissue contusion and laceration with skin staples in place. No evidence of underlying calvarial fracture. CT CSPINE 1. No acute fracture or  malalignment. 2. Exaggerated cervical lordosis likely positional in nature. 3. Mild cervical spondylosis and facet arthropathy. Critical Value/emergent results were called by telephone at the time of interpretation on 06/26/2021 at 7:01 am to provider [facility] , who verbally acknowledged these results. Electronically Signed   By: Jacqulynn Cadet M.D.   On: 06/26/2021 07:03   CT Cervical Spine Wo Contrast  Result Date: 06/26/2021 CLINICAL DATA:  Unwitnessed fall EXAM: CT HEAD WITHOUT CONTRAST CT CERVICAL SPINE WITHOUT CONTRAST TECHNIQUE: Multidetector CT imaging of the head and cervical spine was performed following the standard protocol without intravenous contrast. Multiplanar CT image reconstructions of the cervical spine were also generated. COMPARISON:  Prior CT scan of the head 04/29/2020 FINDINGS: CT HEAD FINDINGS Brain: High attenuation material present layering over the left cerebral hemisphere consistent with acute subdural hematoma. The hematoma measures up to 1 cm in width. No significant midline shift. Cortical and central atrophy. Ex vacuo dilation of the lateral ventricles. No evidence of hydrocephalus. Lacunar infarct in the bilateral cerebellar hemispheres. Moderate periventricular white matter hypoattenuation consistent with chronic microvascular ischemic white matter disease. Left periorbital soft tissue swelling with skin staples in place. Vascular: No hyperdense vessel or unexpected calcification. Skull: Normal. Negative for fracture or focal lesion. Sinuses/Orbits: No acute finding. Other: Left periorbital soft tissue swelling and laceration with skin staples in place. CT CERVICAL SPINE FINDINGS Alignment: Mildly accentuated cervical lordosis. No acute malalignment or subluxation. Skull base and vertebrae: No acute fracture. No primary bone lesion or focal pathologic process. Soft tissues and spinal canal: No prevertebral fluid or swelling. No visible canal hematoma. Disc levels:  Mild multilevel cervical spondylosis. Degenerative changes most significant at C5-C6 were there is loss of disc space height and posterior disc osteophyte complex formation. Mild bilateral facet arthropathy. Upper chest: Negative. Other: None. IMPRESSION: CT HEAD 1. Positive for acute subdural intracranial hemorrhage overlying the left hemisphere. The subdural hematoma measures up to 1 cm in width. No significant midline shift. 2. Chronic atrophy, ex vacuo ventriculomegaly, cerebellar lacunar infarcts and moderately advanced chronic microvascular ischemic white matter disease. 3. Left periorbital soft tissue contusion and laceration with skin staples in place. No evidence of underlying calvarial fracture. CT CSPINE 1. No acute fracture or malalignment. 2. Exaggerated cervical lordosis likely positional in nature. 3. Mild cervical spondylosis and facet arthropathy. Critical Value/emergent results were called by telephone at the time of interpretation on 06/26/2021 at 7:01 am to provider [facility] , who verbally acknowledged these results. Electronically Signed   By: Jacqulynn Cadet M.D.   On: 06/26/2021 07:03      Assessment/Plan Subdural hematoma secondary to fall: Patient had an unwitnessed fall sustaining a laceration to her left brow.  Found to have acute subdural hematoma of the left hemisphere measuring approximately 1 cm in width. -Admit to a MedSurg bed -Neuro check -Recheck CT scan of the head -Hydrocodone as needed for pain  Alzheimer's dementia with behavioral disturbance: Patient was noted to  be confused and restless overnight and had pulled out her IV. -Set bed alarm on -Safety sitter due to increased risk of fall -Continue mittens -Orders for physical therapy discontinued  Hypertensive urgency: Acute.  On admission blood pressure elevated up to 188/98.  Home medication regimen includes Cardizem CD 120 mg daily. -Hydralazine as needed for elevated blood pressure greater than  180  Paroxysmal atrial fibrillation: Patient was not on anticoagulation likely due risk of falls. -Continue Cardizem  Dysphagia -Mechanically soft diet with assistance  DNR hospice care: Prior to arrival.  Family had requested to minimize patient discomfort. -Palliative care consult  DVT prophylaxis: None due to acute bleed Code Status: DNR Family Communication: Son updated at bedside Disposition Plan: To be determined Consults called: Palliative care Admission status: Inpatient, possibly require more than 2 midnight stay due to acute traumatic subdural hematoma  Norval Morton MD [facility]   If 7PM-7AM, please contact night-coverage   06/27/2021, 7:43 AM

## 2021-06-27 NOTE — TOC CAGE-AID Note (Signed)
Transition of Care [facility]) - CAGE-AID Screening   Patient Details  Name: Dawn Buchanan MRN: [id number] Date of Birth: August 20, 1928  Transition of Care [facility]) CM/SW Contact:    Latrese Carolan C Tarpley-Carter, [place] Phone Number: 06/27/2021, 10:02 AM   Clinical Narrative: Pt is unable to participate in Cage Aid.  Pt has dementia.  Quantina Dershem Tarpley-Carter, MSW, LCSW-A Pronouns:  She/Her/Hers [facility] Clinical Social Worker Direct Number:  [phone] [email or web address]  CAGE-AID Screening: Substance Abuse Screening unable to be completed due to: : Patient unable to participate (Pt has dementia.)             Substance Abuse Education Offered: No

## 2021-06-27 NOTE — Plan of Care (Signed)
Pt is alert, confused, restless. Pt has sitter in room. Pt has pulled out Ivs. No distress noted.   Problem: Education: Goal: Knowledge of General Education information will improve Description: Including pain rating scale, medication(s)/side effects and non-pharmacologic comfort measures Outcome: Progressing   Problem: Health Behavior/Discharge Planning: Goal: Ability to manage health-related needs will improve Outcome: Progressing   Problem: Clinical Measurements: Goal: Ability to maintain clinical measurements within normal limits will improve Outcome: Progressing Goal: Will remain free from infection Outcome: Progressing Goal: Diagnostic test results will improve Outcome: Progressing Goal: Respiratory complications will improve Outcome: Progressing Goal: Cardiovascular complication will be avoided Outcome: Progressing   Problem: Activity: Goal: Risk for activity intolerance will decrease Outcome: Progressing   Problem: Nutrition: Goal: Adequate nutrition will be maintained Outcome: Progressing   Problem: Coping: Goal: Level of anxiety will decrease Outcome: Progressing   Problem: Elimination: Goal: Will not experience complications related to bowel motility Outcome: Progressing Goal: Will not experience complications related to urinary retention Outcome: Progressing   Problem: Pain Managment: Goal: General experience of comfort will improve Outcome: Progressing   Problem: Safety: Goal: Ability to remain free from injury will improve Outcome: Progressing   Problem: Skin Integrity: Goal: Risk for impaired skin integrity will decrease Outcome: Progressing

## 2021-06-28 DIAGNOSIS — S065XAA Traumatic subdural hemorrhage with loss of consciousness status unknown, initial encounter: Secondary | ICD-10-CM | POA: Diagnosis not present

## 2021-06-28 DIAGNOSIS — I480 Paroxysmal atrial fibrillation: Secondary | ICD-10-CM | POA: Diagnosis not present

## 2021-06-28 DIAGNOSIS — Z66 Do not resuscitate: Secondary | ICD-10-CM | POA: Diagnosis not present

## 2021-06-28 DIAGNOSIS — S065X0A Traumatic subdural hemorrhage without loss of consciousness, initial encounter: Principal | ICD-10-CM

## 2021-06-28 DIAGNOSIS — G309 Alzheimer's disease, unspecified: Secondary | ICD-10-CM | POA: Diagnosis not present

## 2021-06-28 NOTE — Discharge Summary (Addendum)
Physician Discharge Summary  Dawn Buchanan HBZ:[id number] DOB: May 07, 1928 DOA: 06/26/2021  PCP: Lajean Manes, MD  Admit date: 06/26/2021 Discharge date: 06/28/2021  Admitted From: ALF  Discharge disposition: ALF with hospice  Recommendations for Outpatient Follow-Up:   Follow up with your hospice care provider at the memory care unit  Discharge Diagnosis:   Principal Problem:   Subdural hematoma Active Problems:   AF (paroxysmal atrial fibrillation) ([place])   Alzheimer disease ([place])   Hypertension   Fall   Dysphagia   DNR (do not resuscitate)   Traumatic subdural hematoma   Discharge Condition: Stable  Diet recommendation: soft diet  Wound care: None.  Code status: DNR  History of Present Illness:   Dawn Buchanan is a 87 y.o. female with dementia, hypertension, paroxysmal atrial fibrillation  not on anticoagulation, diastolic CHF last EF 55 -10% with grade 3 diastolic dysfunction, and currently on hospice presented hospital after unwitnessed fall.  Patient was noted to have subdural hematoma and was admitted to the hospital.  CT head scan showed 1 cm in width subdural hematoma on the left.  Patient did have some laceration which was repaired with staples.  Hospice was notified but patient was felt and not stable enough to return so was placed in observation.     Hospital Course:   Following conditions were addressed during hospitalization as listed below,  Subdural hematoma secondary to fall:  CT head scan showed a subdural hematoma.  This has remained stable.  No plan for surgical intervention .   Alzheimer's dementia with behavioral disturbance:  On mittens and sitter at bedside.  On hospice care.  Hypertensive urgency:   On admission blood pressure elevated up to 188/98.  Patient will be resumed on Cardizem CD from home  Paroxysmal atrial fibrillation:  Not on anticoagulation due to risk of falls.  Continue Cardizem.  Dysphagia Continue mechanical soft diet  with assistance.   DNR hospice care: Prior to arrival.  Family had requested to minimize patient discomfort.  Disposition.  At this time, patient is stable for disposition back to assisted living facility with hospice.  Medical Consultants:   None.  Procedures:    None Subjective:   Today, patient was seen and examined at bedside.  Sitter at bedside.  Mildly agitated.  Discharge Exam:   Vitals:   06/28/21 0301 06/28/21 0722  BP: (!) 128/93 127/74  Pulse: 90 88  Resp: 20 16  Temp: 98.2 F (36.8 C) (!) 97.4 F (36.3 C)  SpO2: 100% 100%   Vitals:   06/27/21 1913 06/27/21 2326 06/28/21 0301 06/28/21 0722  BP: 133/87 136/84 (!) 128/93 127/74  Pulse: 98 78 90 88  Resp: 17 16 20 16   Temp: (!) 100.8 F (38.2 C) 98.3 F (36.8 C) 98.2 F (36.8 C) (!) 97.4 F (36.3 C)  TempSrc: Oral Axillary Axillary Oral  SpO2: 100% 99% 100% 100%  Weight:      Height:        General: Alert awake, left periorbital bruise.  Has underlying dementia. HENT: pupils equally reacting to light,  No scleral pallor or icterus noted. Oral mucosa is moist.  Chest:  Clear breath sounds.   No crackles or wheezes.  CVS: S1 &S2 heard. No murmur.  Regular rate and rhythm. Abdomen: Soft, nontender, nondistended.  Bowel sounds are heard.   Extremities: No cyanosis, clubbing or edema.  Peripheral pulses are palpable. Psych: Alert, awake and oriented, normal mood CNS: Moves all extremities, has advanced dementia, periorbital bruise Skin:  Warm and dry.   The results of significant diagnostics from this hospitalization (including imaging, microbiology, ancillary and laboratory) are listed below for reference.     Diagnostic Studies:   CT HEAD WO CONTRAST (5MM)  Result Date: 06/27/2021 CLINICAL DATA:  Follow-up subdural hematoma EXAM: CT HEAD WITHOUT CONTRAST TECHNIQUE: Contiguous axial images were obtained from the base of the skull through the vertex without intravenous contrast. COMPARISON:   06/26/2021 FINDINGS: Brain: Hyperdense subdural hemorrhage is again identified along the left cerebral convexity. No substantial change in size. There is no significant mass effect on the underlying parenchyma. Minimal subdural hemorrhage is also present along the posterior falx. No new hemorrhage. No new loss of gray-white differentiation. Stable findings parenchymal volume loss and chronic microvascular ischemic changes. Small chronic cerebellar infarcts are again noted. Vascular: No new finding. Skull: Calvarium is unremarkable. Sinuses/Orbits: No acute finding. Other: None. IMPRESSION: Stable left cerebral convexity subdural hematoma. No significant mass effect. No new hemorrhage. Electronically Signed   By: Macy Mis M.D.   On: 06/27/2021 10:35   CT Head Wo Contrast  Result Date: 06/26/2021 CLINICAL DATA:  Unwitnessed fall EXAM: CT HEAD WITHOUT CONTRAST CT CERVICAL SPINE WITHOUT CONTRAST TECHNIQUE: Multidetector CT imaging of the head and cervical spine was performed following the standard protocol without intravenous contrast. Multiplanar CT image reconstructions of the cervical spine were also generated. COMPARISON:  Prior CT scan of the head 04/29/2020 FINDINGS: CT HEAD FINDINGS Brain: High attenuation material present layering over the left cerebral hemisphere consistent with acute subdural hematoma. The hematoma measures up to 1 cm in width. No significant midline shift. Cortical and central atrophy. Ex vacuo dilation of the lateral ventricles. No evidence of hydrocephalus. Lacunar infarct in the bilateral cerebellar hemispheres. Moderate periventricular white matter hypoattenuation consistent with chronic microvascular ischemic white matter disease. Left periorbital soft tissue swelling with skin staples in place. Vascular: No hyperdense vessel or unexpected calcification. Skull: Normal. Negative for fracture or focal lesion. Sinuses/Orbits: No acute finding. Other: Left periorbital soft tissue  swelling and laceration with skin staples in place. CT CERVICAL SPINE FINDINGS Alignment: Mildly accentuated cervical lordosis. No acute malalignment or subluxation. Skull base and vertebrae: No acute fracture. No primary bone lesion or focal pathologic process. Soft tissues and spinal canal: No prevertebral fluid or swelling. No visible canal hematoma. Disc levels: Mild multilevel cervical spondylosis. Degenerative changes most significant at C5-C6 were there is loss of disc space height and posterior disc osteophyte complex formation. Mild bilateral facet arthropathy. Upper chest: Negative. Other: None. IMPRESSION: CT HEAD 1. Positive for acute subdural intracranial hemorrhage overlying the left hemisphere. The subdural hematoma measures up to 1 cm in width. No significant midline shift. 2. Chronic atrophy, ex vacuo ventriculomegaly, cerebellar lacunar infarcts and moderately advanced chronic microvascular ischemic white matter disease. 3. Left periorbital soft tissue contusion and laceration with skin staples in place. No evidence of underlying calvarial fracture. CT CSPINE 1. No acute fracture or malalignment. 2. Exaggerated cervical lordosis likely positional in nature. 3. Mild cervical spondylosis and facet arthropathy. Critical Value/emergent results were called by telephone at the time of interpretation on 06/26/2021 at 7:01 am to provider [facility] , who verbally acknowledged these results. Electronically Signed   By: Jacqulynn Cadet M.D.   On: 06/26/2021 07:03   CT Cervical Spine Wo Contrast  Result Date: 06/26/2021 CLINICAL DATA:  Unwitnessed fall EXAM: CT HEAD WITHOUT CONTRAST CT CERVICAL SPINE WITHOUT CONTRAST TECHNIQUE: Multidetector CT imaging of the head and cervical spine was performed  following the standard protocol without intravenous contrast. Multiplanar CT image reconstructions of the cervical spine were also generated. COMPARISON:  Prior CT scan of the head 04/29/2020 FINDINGS: CT  HEAD FINDINGS Brain: High attenuation material present layering over the left cerebral hemisphere consistent with acute subdural hematoma. The hematoma measures up to 1 cm in width. No significant midline shift. Cortical and central atrophy. Ex vacuo dilation of the lateral ventricles. No evidence of hydrocephalus. Lacunar infarct in the bilateral cerebellar hemispheres. Moderate periventricular white matter hypoattenuation consistent with chronic microvascular ischemic white matter disease. Left periorbital soft tissue swelling with skin staples in place. Vascular: No hyperdense vessel or unexpected calcification. Skull: Normal. Negative for fracture or focal lesion. Sinuses/Orbits: No acute finding. Other: Left periorbital soft tissue swelling and laceration with skin staples in place. CT CERVICAL SPINE FINDINGS Alignment: Mildly accentuated cervical lordosis. No acute malalignment or subluxation. Skull base and vertebrae: No acute fracture. No primary bone lesion or focal pathologic process. Soft tissues and spinal canal: No prevertebral fluid or swelling. No visible canal hematoma. Disc levels: Mild multilevel cervical spondylosis. Degenerative changes most significant at C5-C6 were there is loss of disc space height and posterior disc osteophyte complex formation. Mild bilateral facet arthropathy. Upper chest: Negative. Other: None. IMPRESSION: CT HEAD 1. Positive for acute subdural intracranial hemorrhage overlying the left hemisphere. The subdural hematoma measures up to 1 cm in width. No significant midline shift. 2. Chronic atrophy, ex vacuo ventriculomegaly, cerebellar lacunar infarcts and moderately advanced chronic microvascular ischemic white matter disease. 3. Left periorbital soft tissue contusion and laceration with skin staples in place. No evidence of underlying calvarial fracture. CT CSPINE 1. No acute fracture or malalignment. 2. Exaggerated cervical lordosis likely positional in nature. 3. Mild  cervical spondylosis and facet arthropathy. Critical Value/emergent results were called by telephone at the time of interpretation on 06/26/2021 at 7:01 am to provider [facility] , who verbally acknowledged these results. Electronically Signed   By: Jacqulynn Cadet M.D.   On: 06/26/2021 07:03     Labs:   Basic Metabolic Panel: No results for input(s): NA, K, CL, CO2, GLUCOSE, BUN, CREATININE, CALCIUM, MG, PHOS in the last 168 hours. GFR CrCl cannot be calculated (Patient's most recent lab result is older than the maximum 21 days allowed.). Liver Function Tests: No results for input(s): AST, ALT, ALKPHOS, BILITOT, PROT, ALBUMIN in the last 168 hours. No results for input(s): LIPASE, AMYLASE in the last 168 hours. No results for input(s): AMMONIA in the last 168 hours. Coagulation profile No results for input(s): INR, PROTIME in the last 168 hours.  CBC: No results for input(s): WBC, NEUTROABS, HGB, HCT, MCV, PLT in the last 168 hours. Cardiac Enzymes: No results for input(s): CKTOTAL, CKMB, CKMBINDEX, TROPONINI in the last 168 hours. BNP: Invalid input(s): POCBNP CBG: No results for input(s): GLUCAP in the last 168 hours. D-Dimer No results for input(s): DDIMER in the last 72 hours. Hgb A1c No results for input(s): HGBA1C in the last 72 hours. Lipid Profile No results for input(s): CHOL, HDL, LDLCALC, TRIG, CHOLHDL, LDLDIRECT in the last 72 hours. Thyroid function studies No results for input(s): TSH, T4TOTAL, T3FREE, THYROIDAB in the last 72 hours.  Invalid input(s): FREET3 Anemia work up No results for input(s): VITAMINB12, FOLATE, FERRITIN, TIBC, IRON, RETICCTPCT in the last 72 hours. Microbiology Recent Results (from the past 240 hour(s))  Resp Panel by RT-PCR (Flu A&B, Covid) Nasopharyngeal Swab     Status: None   Collection Time: 06/26/21 12:18  PM   Specimen: Nasopharyngeal Swab; Nasopharyngeal(NP) swabs in vial transport medium  Result Value Ref Range Status    SARS Coronavirus 2 by RT PCR NEGATIVE NEGATIVE Final    Comment: (NOTE) SARS-CoV-2 target nucleic acids are NOT DETECTED.  The SARS-CoV-2 RNA is generally detectable in upper respiratory specimens during the acute phase of infection. The lowest concentration of SARS-CoV-2 viral copies this assay can detect is 138 copies/mL. A negative result does not preclude SARS-Cov-2 infection and should not be used as the sole basis for treatment or other patient management decisions. A negative result may occur with  improper specimen collection/handling, submission of specimen other than nasopharyngeal swab, presence of viral mutation(s) within the areas targeted by this assay, and inadequate number of viral copies(<138 copies/mL). A negative result must be combined with clinical observations, patient history, and epidemiological information. The expected result is Negative.  Fact Sheet for Patients:  EntrepreneurPulse.com.au  Fact Sheet for Healthcare Providers:  IncredibleEmployment.be  This test is no t yet approved or cleared by the [place] FDA and  has been authorized for detection and/or diagnosis of SARS-CoV-2 by FDA under an Emergency Use Authorization (EUA). This EUA will remain  in effect (meaning this test can be used) for the duration of the COVID-19 declaration under Section 564(b)(1) of the Act, 21 U.S.C.section 360bbb-3(b)(1), unless the authorization is terminated  or revoked sooner.       Influenza A by PCR NEGATIVE NEGATIVE Final   Influenza B by PCR NEGATIVE NEGATIVE Final    Comment: (NOTE) The Xpert Xpress SARS-CoV-2/FLU/RSV plus assay is intended as an aid in the diagnosis of influenza from Nasopharyngeal swab specimens and should not be used as a sole basis for treatment. Nasal washings and aspirates are unacceptable for Xpert Xpress SARS-CoV-2/FLU/RSV testing.  Fact Sheet for  Patients: EntrepreneurPulse.com.au  Fact Sheet for Healthcare Providers: IncredibleEmployment.be  This test is not yet approved or cleared by the [place] FDA and has been authorized for detection and/or diagnosis of SARS-CoV-2 by FDA under an Emergency Use Authorization (EUA). This EUA will remain in effect (meaning this test can be used) for the duration of the COVID-19 declaration under Section 564(b)(1) of the Act, 21 U.S.C. section 360bbb-3(b)(1), unless the authorization is terminated or revoked.  Performed at [facility], [address] [place], [place] [address]      Discharge Instructions:   Discharge Instructions     Diet - low sodium heart healthy   Complete by: As directed    Discharge instructions   Complete by: As directed    Further care as per hospice team.   Increase activity slowly   Complete by: As directed    No wound care   Complete by: As directed       Allergies as of 06/28/2021   No Known Allergies      Medication List     TAKE these medications    diltiazem 120 MG 24 hr capsule Commonly known as: CARDIZEM CD Take 1 capsule (120 mg total) by mouth daily.   loperamide 2 MG tablet Commonly known as: IMODIUM A-D Take 2 mg by mouth every 6 (six) hours as needed for diarrhea or loose stools.   Mineral Oil Oil Place 2 drops into both ears every Friday.   Nutritional Supplement Liqd Take 1 Dose by mouth See admin instructions. After each meal - Mighty shake   traMADol 50 MG tablet Commonly known as: ULTRAM Take 1 tablet (50 mg total) by  mouth every 6 (six) hours as needed for moderate pain.         Time coordinating discharge: 39 minutes  Signed:  Adrian Specht  [facility] 06/28/2021, 9:51 AM

## 2021-06-28 NOTE — NC FL2 (Signed)
[place] LEVEL OF CARE SCREENING TOOL     IDENTIFICATION  Patient Name: Dawn Buchanan Birthdate: [birth year] Sex: female Admission Date (Current Location): 06/26/2021  [facility] and [place] Number:  Herbalist and Address:  [facility] [place], [place] [address] [place], [place] [address]      Provider Number: [id number]  Attending Physician Name and Address:  Flora Lipps, MD  Relative Name and Phone Number:  Jennefer, Kopp [phone]    Current Level of Care: Hospital Recommended Level of Care: Memory Care, [place] (secured unit) Prior Approval Number:    Date Approved/Denied:   PASRR Number:    Discharge Plan: Other (Comment) (Assisted living facility)    Current Diagnoses: Patient Active Problem List   Diagnosis Date Noted   Hypertension 06/27/2021   Fall 06/27/2021   Dysphagia 06/27/2021   DNR (do not resuscitate) 06/27/2021   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   Subdural hematoma 06/26/2021   Diverticulitis 01/12/2020   AF (paroxysmal atrial fibrillation) ([place]) 01/12/2020   Alzheimer disease ([place]) 01/12/2020   Chronic diastolic CHF (congestive heart failure) ([place]) 01/12/2020    Orientation RESPIRATION BLADDER Height & Weight      (Not oriented)  Normal Incontinent Weight: 106 lb 11.2 oz (48.4 kg) Height:  5\' 3"  (160 cm)  BEHAVIORAL SYMPTOMS/MOOD NEUROLOGICAL BOWEL NUTRITION STATUS      Incontinent  (texture modified diet)  AMBULATORY STATUS COMMUNICATION OF NEEDS Skin   Extensive Assist Does not communicate Skin abrasions                       Personal Care Assistance Level of Assistance  Bathing, Feeding, Dressing Bathing Assistance: Maximum assistance Feeding assistance: Maximum assistance Dressing Assistance: Maximum assistance     Functional Limitations Info  Sight, Hearing, Speech Sight Info: Adequate Hearing Info: Impaired Speech Info: Impaired    SPECIAL CARE FACTORS  FREQUENCY                       Contractures Contractures Info: Not present    Additional Factors Info  Code Status, Allergies Code Status Info: DNR Allergies Info: NKA           Current Medications (06/28/2021):  This is the current hospital active medication list Current Facility-Administered Medications  Medication Dose Route Frequency Provider Last Rate Last Admin   acetaminophen (TYLENOL) tablet 650 mg  650 mg Oral Q6H PRN Fuller Plan A, MD   650 mg at 06/27/21 1935   Or   acetaminophen (TYLENOL) suppository 650 mg  650 mg Rectal Q6H PRN Fuller Plan A, MD       albuterol (PROVENTIL) (2.5 MG/3ML) 0.083% nebulizer solution 2.5 mg  2.5 mg Nebulization Q6H PRN Tamala Julian, Rondell A, MD       diltiazem (CARDIZEM CD) 24 hr capsule 120 mg  120 mg Oral Daily Smith, Rondell A, MD   120 mg at 06/28/21 1036   feeding supplement (BOOST / RESOURCE BREEZE) liquid 1 Container  1 Container Oral TID BM Norval Morton, MD   1 Container at 06/28/21 0919   Gerhardt's butt cream   Topical BID Howerter, Justin B, DO   Given at 06/28/21 0920   hydrALAZINE (APRESOLINE) tablet 25 mg  25 mg Oral Q6H PRN Fuller Plan A, MD       HYDROcodone-acetaminophen (NORCO/VICODIN) 5-325 MG per tablet 1 tablet  1 tablet Oral Q6H PRN Norval Morton, MD  1 tablet at 06/28/21 0334     Discharge Medications: Please see discharge summary for a list of discharge medications.  Relevant Imaging Results:  Relevant Lab Results:   Additional Information SSN: [id number]  [name] [name], LCSW

## 2021-06-28 NOTE — Plan of Care (Signed)

## 2021-06-28 NOTE — Progress Notes (Addendum)
Called at 1400 to give report to [facility] no answer Called again, left a voicemail with number listed in [place] note.  [phone] - gave report to [facility]

## 2021-06-28 NOTE — TOC Transition Note (Signed)
Transition of Care [facility]) - CM/SW Discharge Note   Patient Details  Name: Brynne Doane MRN: [id number] Date of Birth: September 15, 1927  [facility] CM/SW Contact:  Carles Collet, RN Phone Number: 06/28/2021, 10:07 AM   Clinical Narrative:    Received call from patient's son. He states that he feels that the patient is back to her baseline. He feels she can return to ALF. He has been to ALF and discussed this with them and plans will be for her to return. Discussed with MD and he will DC today. CSW will facilitate with [facility] Patient will need transportation via [facility] due to affiliation with [facility].      Barriers to Discharge: Continued Medical Work up   Patient Goals and CMS Choice Patient states their goals for this hospitalization and ongoing recovery are:: to go to [facility] for hospice care CMS Medicare.gov Compare Post Acute Care list provided to:: Other (Comment Required) Choice offered to / list presented to : Adult Children  Discharge Placement                       Discharge Plan and Services In-house Referral: Clinical Social Work Discharge Planning Services: CM Consult                                 Social Determinants of Health (SDOH) Interventions     Readmission Risk Interventions No flowsheet data found.

## 2021-06-28 NOTE — TOC Transition Note (Signed)
Transition of Care [facility]) - CM/SW Discharge Note   Patient Details  Name: Dawn Buchanan MRN: [id number] Date of Birth: 07-28-1928  Transition of [facility]) CM/SW Contact:  Joanne Chars, LCSW Phone Number: 06/28/2021, 2:04 PM   Clinical Narrative:   Pt discharging to [facility].  RN please call Edwena Blow at [phone] with report.  [facility] provide [facility], as this is [facility] pt.  Shanita at [employer] notified of pt discharge.       Barriers to Discharge: Continued Medical Work up   Patient Goals and CMS Choice Patient states their goals for this hospitalization and ongoing recovery are:: to go to [facility] for hospice care CMS Medicare.gov Compare Post Acute Care list provided to:: Other (Comment Required) Choice offered to / list presented to : Adult Children  Discharge Placement                       Discharge Plan and Services In-house Referral: Clinical Social Work Discharge Planning Services: CM Consult                                 Social Determinants of Health (SDOH) Interventions     Readmission Risk Interventions No flowsheet data found.

## 2021-06-28 NOTE — NC FL2 (Signed)
[place] LEVEL OF CARE SCREENING TOOL     IDENTIFICATION  Patient Name: Dawn Buchanan Birthdate: 29-Sep-1927 Sex: female Admission Date (Current Location): 06/26/2021  [facility] and [place] Number:  Herbalist and Address:  [facility] [place], [place] [address], [place], [place] [address]      Provider Number: [id number]  Attending Physician Name and Address:  Flora Lipps, MD  Relative Name and Phone Number:  [name], [name] [phone]    Current Level of Care: Hospital Recommended Level of Care: [place] Prior Approval Number:    Date Approved/Denied:   PASRR Number:    Discharge Plan: Other (Comment) (Assisted living facility)    Current Diagnoses: Patient Active Problem List   Diagnosis Date Noted   Hypertension 06/27/2021   Fall 06/27/2021   Dysphagia 06/27/2021   DNR (do not resuscitate) 06/27/2021   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   Subdural hematoma 06/26/2021   Diverticulitis 01/12/2020   AF (paroxysmal atrial fibrillation) ([place]) 01/12/2020   Alzheimer disease ([place]) 01/12/2020   Chronic diastolic CHF (congestive heart failure) ([place]) 01/12/2020    Orientation RESPIRATION BLADDER Height & Weight      (Not oriented)  Normal Incontinent Weight: 106 lb 11.2 oz (48.4 kg) Height:  5\' 3"  (160 cm)  BEHAVIORAL SYMPTOMS/MOOD NEUROLOGICAL BOWEL NUTRITION STATUS      Incontinent Diet (see discharge summary)  AMBULATORY STATUS COMMUNICATION OF NEEDS Skin   Extensive Assist Does not communicate Skin abrasions                       Personal Care Assistance Level of Assistance  Bathing, Feeding, Dressing Bathing Assistance: Maximum assistance Feeding assistance: Maximum assistance Dressing Assistance: Maximum assistance     Functional Limitations Info  Sight, Hearing, Speech Sight Info: Adequate Hearing Info: Impaired Speech Info: Impaired    SPECIAL CARE FACTORS FREQUENCY                        Contractures Contractures Info: Not present    Additional Factors Info  Code Status, Allergies Code Status Info: DNR Allergies Info: NKA           Current Medications (06/28/2021):  This is the current hospital active medication list Current Facility-Administered Medications  Medication Dose Route Frequency Provider Last Rate Last Admin   acetaminophen (TYLENOL) tablet 650 mg  650 mg Oral Q6H PRN Fuller Plan A, MD   650 mg at 06/27/21 1935   Or   acetaminophen (TYLENOL) suppository 650 mg  650 mg Rectal Q6H PRN Fuller Plan A, MD       albuterol (PROVENTIL) (2.5 MG/3ML) 0.083% nebulizer solution 2.5 mg  2.5 mg Nebulization Q6H PRN Tamala Julian, Rondell A, MD       diltiazem (CARDIZEM CD) 24 hr capsule 120 mg  120 mg Oral Daily Smith, Rondell A, MD   120 mg at 06/28/21 1036   feeding supplement (BOOST / RESOURCE BREEZE) liquid 1 Container  1 Container Oral TID BM Norval Morton, MD   1 Container at 06/28/21 0919   Gerhardt's butt cream   Topical BID Howerter, Justin B, DO   Given at 06/28/21 0920   hydrALAZINE (APRESOLINE) tablet 25 mg  25 mg Oral Q6H PRN Norval Morton, MD       HYDROcodone-acetaminophen (NORCO/VICODIN) 5-325 MG per tablet 1 tablet  1 tablet Oral Q6H PRN Norval Morton, MD   1 tablet at 06/28/21  0334     Discharge Medications: Please see discharge summary for a list of discharge medications.  Relevant Imaging Results:  Relevant Lab Results:   Additional Information SSN: [id number] [phone]  [name] [name], LCSW

## 2021-06-28 NOTE — Plan of Care (Signed)
Pt is alert, disoriented x 4. Pt crys and yells out at times and becomes agitated when repositioned. Pt tried to get out of bed a few times. Pt has order for sitter for safety. PRN tylenol given for temp of 100.8. PRN Norco given for signs of pain. Pt has redness to perineal area, pt noted scratching. On call [facility] provider paged to inform of redness and foul urine odor. Provider ordered some barrier cream. Pt now resting.    Problem: Education: Goal: Knowledge of General Education information will improve Description: Including pain rating scale, medication(s)/side effects and non-pharmacologic comfort measures Outcome: Progressing   Problem: Health Behavior/Discharge Planning: Goal: Ability to manage health-related needs will improve Outcome: Progressing   Problem: Clinical Measurements: Goal: Ability to maintain clinical measurements within normal limits will improve Outcome: Progressing Goal: Will remain free from infection Outcome: Progressing Goal: Diagnostic test results will improve Outcome: Progressing Goal: Respiratory complications will improve Outcome: Progressing Goal: Cardiovascular complication will be avoided Outcome: Progressing   Problem: Activity: Goal: Risk for activity intolerance will decrease Outcome: Progressing   Problem: Nutrition: Goal: Adequate nutrition will be maintained Outcome: Progressing   Problem: Coping: Goal: Level of anxiety will decrease Outcome: Progressing   Problem: Elimination: Goal: Will not experience complications related to bowel motility Outcome: Progressing Goal: Will not experience complications related to urinary retention Outcome: Progressing   Problem: Pain Managment: Goal: General experience of comfort will improve Outcome: Progressing   Problem: Safety: Goal: Ability to remain free from injury will improve Outcome: Progressing   Problem: Skin Integrity: Goal: Risk for impaired skin integrity will  decrease Outcome: Progressing

## 2021-06-28 NOTE — Progress Notes (Signed)
[facility] night cross cover note:  I was contacted by RN who conveyed the patient's temperature of 100.8, presence of foul-smelling urine, as well as erythema to the perineal area.   Following my chart review , including review of most recent documentation from [facility], this appears to be hospice patient here with fall and subdural hematoma, with documentation that the patient's family does not want her to have any additional blood draws or laboratory evaluation.  To be consistent with these wishes, will refrain from checking urinalysis and cbc.  Confirmed order for as needed acetaminophen.  Potential noninfectious sources for patient's elevated temperature include her presenting subdural hematoma.  RN conveyed that the patient has urinary incontinence, has a Purewick, and that the perineal erythema does not appear consistent with yeast.  I subsequently ordered Gerhardt's topical to serve as a barrier cream to be applied to the perineal area.     Babs Bertin, DO Hospitalist

## 2021-06-29 DIAGNOSIS — F0280 Dementia in other diseases classified elsewhere without behavioral disturbance: Secondary | ICD-10-CM | POA: Diagnosis not present

## 2021-06-29 DIAGNOSIS — G309 Alzheimer's disease, unspecified: Secondary | ICD-10-CM | POA: Diagnosis not present

## 2021-06-29 DIAGNOSIS — I119 Hypertensive heart disease without heart failure: Secondary | ICD-10-CM | POA: Diagnosis not present

## 2021-06-29 DIAGNOSIS — R634 Abnormal weight loss: Secondary | ICD-10-CM | POA: Diagnosis not present

## 2021-06-29 DIAGNOSIS — E46 Unspecified protein-calorie malnutrition: Secondary | ICD-10-CM | POA: Diagnosis not present

## 2021-06-29 DIAGNOSIS — Z681 Body mass index (BMI) 19 or less, adult: Secondary | ICD-10-CM | POA: Diagnosis not present

## 2021-07-02 DIAGNOSIS — I119 Hypertensive heart disease without heart failure: Secondary | ICD-10-CM | POA: Diagnosis not present

## 2021-07-02 DIAGNOSIS — R634 Abnormal weight loss: Secondary | ICD-10-CM | POA: Diagnosis not present

## 2021-07-02 DIAGNOSIS — Z681 Body mass index (BMI) 19 or less, adult: Secondary | ICD-10-CM | POA: Diagnosis not present

## 2021-07-02 DIAGNOSIS — F0280 Dementia in other diseases classified elsewhere without behavioral disturbance: Secondary | ICD-10-CM | POA: Diagnosis not present

## 2021-07-02 DIAGNOSIS — G309 Alzheimer's disease, unspecified: Secondary | ICD-10-CM | POA: Diagnosis not present

## 2021-07-02 DIAGNOSIS — E46 Unspecified protein-calorie malnutrition: Secondary | ICD-10-CM | POA: Diagnosis not present

## 2021-07-04 DIAGNOSIS — I119 Hypertensive heart disease without heart failure: Secondary | ICD-10-CM | POA: Diagnosis not present

## 2021-07-04 DIAGNOSIS — G309 Alzheimer's disease, unspecified: Secondary | ICD-10-CM | POA: Diagnosis not present

## 2021-07-04 DIAGNOSIS — F0280 Dementia in other diseases classified elsewhere without behavioral disturbance: Secondary | ICD-10-CM | POA: Diagnosis not present

## 2021-07-04 DIAGNOSIS — Z681 Body mass index (BMI) 19 or less, adult: Secondary | ICD-10-CM | POA: Diagnosis not present

## 2021-07-04 DIAGNOSIS — R634 Abnormal weight loss: Secondary | ICD-10-CM | POA: Diagnosis not present

## 2021-07-04 DIAGNOSIS — E46 Unspecified protein-calorie malnutrition: Secondary | ICD-10-CM | POA: Diagnosis not present

## 2021-07-06 DIAGNOSIS — Z681 Body mass index (BMI) 19 or less, adult: Secondary | ICD-10-CM | POA: Diagnosis not present

## 2021-07-06 DIAGNOSIS — R634 Abnormal weight loss: Secondary | ICD-10-CM | POA: Diagnosis not present

## 2021-07-06 DIAGNOSIS — E46 Unspecified protein-calorie malnutrition: Secondary | ICD-10-CM | POA: Diagnosis not present

## 2021-07-06 DIAGNOSIS — G309 Alzheimer's disease, unspecified: Secondary | ICD-10-CM | POA: Diagnosis not present

## 2021-07-06 DIAGNOSIS — F0280 Dementia in other diseases classified elsewhere without behavioral disturbance: Secondary | ICD-10-CM | POA: Diagnosis not present

## 2021-07-06 DIAGNOSIS — I119 Hypertensive heart disease without heart failure: Secondary | ICD-10-CM | POA: Diagnosis not present

## 2021-07-07 DIAGNOSIS — F0280 Dementia in other diseases classified elsewhere without behavioral disturbance: Secondary | ICD-10-CM | POA: Diagnosis not present

## 2021-07-07 DIAGNOSIS — Z681 Body mass index (BMI) 19 or less, adult: Secondary | ICD-10-CM | POA: Diagnosis not present

## 2021-07-07 DIAGNOSIS — I119 Hypertensive heart disease without heart failure: Secondary | ICD-10-CM | POA: Diagnosis not present

## 2021-07-07 DIAGNOSIS — R634 Abnormal weight loss: Secondary | ICD-10-CM | POA: Diagnosis not present

## 2021-07-07 DIAGNOSIS — E46 Unspecified protein-calorie malnutrition: Secondary | ICD-10-CM | POA: Diagnosis not present

## 2021-07-07 DIAGNOSIS — G309 Alzheimer's disease, unspecified: Secondary | ICD-10-CM | POA: Diagnosis not present

## 2021-07-10 DIAGNOSIS — I129 Hypertensive chronic kidney disease with stage 1 through stage 4 chronic kidney disease, or unspecified chronic kidney disease: Secondary | ICD-10-CM | POA: Diagnosis not present

## 2021-07-10 DIAGNOSIS — E8881 Metabolic syndrome: Secondary | ICD-10-CM | POA: Diagnosis not present

## 2021-07-10 DIAGNOSIS — N1831 Chronic kidney disease, stage 3a: Secondary | ICD-10-CM | POA: Diagnosis not present

## 2021-07-10 DIAGNOSIS — I480 Paroxysmal atrial fibrillation: Secondary | ICD-10-CM | POA: Diagnosis not present

## 2021-07-10 DIAGNOSIS — G301 Alzheimer's disease with late onset: Secondary | ICD-10-CM | POA: Diagnosis not present

## 2021-07-11 DIAGNOSIS — F0280 Dementia in other diseases classified elsewhere without behavioral disturbance: Secondary | ICD-10-CM | POA: Diagnosis not present

## 2021-07-11 DIAGNOSIS — E46 Unspecified protein-calorie malnutrition: Secondary | ICD-10-CM | POA: Diagnosis not present

## 2021-07-11 DIAGNOSIS — R634 Abnormal weight loss: Secondary | ICD-10-CM | POA: Diagnosis not present

## 2021-07-11 DIAGNOSIS — I119 Hypertensive heart disease without heart failure: Secondary | ICD-10-CM | POA: Diagnosis not present

## 2021-07-11 DIAGNOSIS — Z681 Body mass index (BMI) 19 or less, adult: Secondary | ICD-10-CM | POA: Diagnosis not present

## 2021-07-11 DIAGNOSIS — G309 Alzheimer's disease, unspecified: Secondary | ICD-10-CM | POA: Diagnosis not present

## 2021-07-14 DIAGNOSIS — Z681 Body mass index (BMI) 19 or less, adult: Secondary | ICD-10-CM | POA: Diagnosis not present

## 2021-07-14 DIAGNOSIS — E46 Unspecified protein-calorie malnutrition: Secondary | ICD-10-CM | POA: Diagnosis not present

## 2021-07-14 DIAGNOSIS — G309 Alzheimer's disease, unspecified: Secondary | ICD-10-CM | POA: Diagnosis not present

## 2021-07-14 DIAGNOSIS — R634 Abnormal weight loss: Secondary | ICD-10-CM | POA: Diagnosis not present

## 2021-07-14 DIAGNOSIS — F0280 Dementia in other diseases classified elsewhere without behavioral disturbance: Secondary | ICD-10-CM | POA: Diagnosis not present

## 2021-07-14 DIAGNOSIS — I119 Hypertensive heart disease without heart failure: Secondary | ICD-10-CM | POA: Diagnosis not present

## 2021-07-18 DIAGNOSIS — R634 Abnormal weight loss: Secondary | ICD-10-CM | POA: Diagnosis not present

## 2021-07-18 DIAGNOSIS — G309 Alzheimer's disease, unspecified: Secondary | ICD-10-CM | POA: Diagnosis not present

## 2021-07-18 DIAGNOSIS — I119 Hypertensive heart disease without heart failure: Secondary | ICD-10-CM | POA: Diagnosis not present

## 2021-07-18 DIAGNOSIS — E46 Unspecified protein-calorie malnutrition: Secondary | ICD-10-CM | POA: Diagnosis not present

## 2021-07-18 DIAGNOSIS — Z681 Body mass index (BMI) 19 or less, adult: Secondary | ICD-10-CM | POA: Diagnosis not present

## 2021-07-18 DIAGNOSIS — F0280 Dementia in other diseases classified elsewhere without behavioral disturbance: Secondary | ICD-10-CM | POA: Diagnosis not present

## 2021-07-20 DIAGNOSIS — Z681 Body mass index (BMI) 19 or less, adult: Secondary | ICD-10-CM | POA: Diagnosis not present

## 2021-07-20 DIAGNOSIS — E46 Unspecified protein-calorie malnutrition: Secondary | ICD-10-CM | POA: Diagnosis not present

## 2021-07-20 DIAGNOSIS — F0280 Dementia in other diseases classified elsewhere without behavioral disturbance: Secondary | ICD-10-CM | POA: Diagnosis not present

## 2021-07-20 DIAGNOSIS — I119 Hypertensive heart disease without heart failure: Secondary | ICD-10-CM | POA: Diagnosis not present

## 2021-07-20 DIAGNOSIS — R634 Abnormal weight loss: Secondary | ICD-10-CM | POA: Diagnosis not present

## 2021-07-20 DIAGNOSIS — G309 Alzheimer's disease, unspecified: Secondary | ICD-10-CM | POA: Diagnosis not present

## 2021-07-20 DIAGNOSIS — Z8719 Personal history of other diseases of the digestive system: Secondary | ICD-10-CM | POA: Diagnosis not present

## 2021-07-20 DIAGNOSIS — I482 Chronic atrial fibrillation, unspecified: Secondary | ICD-10-CM | POA: Diagnosis not present

## 2021-07-21 DIAGNOSIS — E46 Unspecified protein-calorie malnutrition: Secondary | ICD-10-CM | POA: Diagnosis not present

## 2021-07-21 DIAGNOSIS — I119 Hypertensive heart disease without heart failure: Secondary | ICD-10-CM | POA: Diagnosis not present

## 2021-07-21 DIAGNOSIS — R634 Abnormal weight loss: Secondary | ICD-10-CM | POA: Diagnosis not present

## 2021-07-21 DIAGNOSIS — Z681 Body mass index (BMI) 19 or less, adult: Secondary | ICD-10-CM | POA: Diagnosis not present

## 2021-07-21 DIAGNOSIS — F0280 Dementia in other diseases classified elsewhere without behavioral disturbance: Secondary | ICD-10-CM | POA: Diagnosis not present

## 2021-07-21 DIAGNOSIS — G309 Alzheimer's disease, unspecified: Secondary | ICD-10-CM | POA: Diagnosis not present

## 2021-07-25 DIAGNOSIS — I119 Hypertensive heart disease without heart failure: Secondary | ICD-10-CM | POA: Diagnosis not present

## 2021-07-25 DIAGNOSIS — F0280 Dementia in other diseases classified elsewhere without behavioral disturbance: Secondary | ICD-10-CM | POA: Diagnosis not present

## 2021-07-25 DIAGNOSIS — Z681 Body mass index (BMI) 19 or less, adult: Secondary | ICD-10-CM | POA: Diagnosis not present

## 2021-07-25 DIAGNOSIS — E46 Unspecified protein-calorie malnutrition: Secondary | ICD-10-CM | POA: Diagnosis not present

## 2021-07-25 DIAGNOSIS — R634 Abnormal weight loss: Secondary | ICD-10-CM | POA: Diagnosis not present

## 2021-07-25 DIAGNOSIS — G309 Alzheimer's disease, unspecified: Secondary | ICD-10-CM | POA: Diagnosis not present

## 2021-07-26 DIAGNOSIS — R634 Abnormal weight loss: Secondary | ICD-10-CM | POA: Diagnosis not present

## 2021-07-26 DIAGNOSIS — G309 Alzheimer's disease, unspecified: Secondary | ICD-10-CM | POA: Diagnosis not present

## 2021-07-26 DIAGNOSIS — I119 Hypertensive heart disease without heart failure: Secondary | ICD-10-CM | POA: Diagnosis not present

## 2021-07-26 DIAGNOSIS — Z681 Body mass index (BMI) 19 or less, adult: Secondary | ICD-10-CM | POA: Diagnosis not present

## 2021-07-26 DIAGNOSIS — E46 Unspecified protein-calorie malnutrition: Secondary | ICD-10-CM | POA: Diagnosis not present

## 2021-07-26 DIAGNOSIS — F0280 Dementia in other diseases classified elsewhere without behavioral disturbance: Secondary | ICD-10-CM | POA: Diagnosis not present

## 2021-07-29 DIAGNOSIS — R634 Abnormal weight loss: Secondary | ICD-10-CM | POA: Diagnosis not present

## 2021-07-29 DIAGNOSIS — I119 Hypertensive heart disease without heart failure: Secondary | ICD-10-CM | POA: Diagnosis not present

## 2021-07-29 DIAGNOSIS — E46 Unspecified protein-calorie malnutrition: Secondary | ICD-10-CM | POA: Diagnosis not present

## 2021-07-29 DIAGNOSIS — F0280 Dementia in other diseases classified elsewhere without behavioral disturbance: Secondary | ICD-10-CM | POA: Diagnosis not present

## 2021-07-29 DIAGNOSIS — G309 Alzheimer's disease, unspecified: Secondary | ICD-10-CM | POA: Diagnosis not present

## 2021-07-29 DIAGNOSIS — Z681 Body mass index (BMI) 19 or less, adult: Secondary | ICD-10-CM | POA: Diagnosis not present

## 2021-08-01 DIAGNOSIS — Z681 Body mass index (BMI) 19 or less, adult: Secondary | ICD-10-CM | POA: Diagnosis not present

## 2021-08-01 DIAGNOSIS — E46 Unspecified protein-calorie malnutrition: Secondary | ICD-10-CM | POA: Diagnosis not present

## 2021-08-01 DIAGNOSIS — G309 Alzheimer's disease, unspecified: Secondary | ICD-10-CM | POA: Diagnosis not present

## 2021-08-01 DIAGNOSIS — R634 Abnormal weight loss: Secondary | ICD-10-CM | POA: Diagnosis not present

## 2021-08-01 DIAGNOSIS — F0280 Dementia in other diseases classified elsewhere without behavioral disturbance: Secondary | ICD-10-CM | POA: Diagnosis not present

## 2021-08-01 DIAGNOSIS — I119 Hypertensive heart disease without heart failure: Secondary | ICD-10-CM | POA: Diagnosis not present

## 2021-08-03 DIAGNOSIS — F0280 Dementia in other diseases classified elsewhere without behavioral disturbance: Secondary | ICD-10-CM | POA: Diagnosis not present

## 2021-08-03 DIAGNOSIS — E46 Unspecified protein-calorie malnutrition: Secondary | ICD-10-CM | POA: Diagnosis not present

## 2021-08-03 DIAGNOSIS — R634 Abnormal weight loss: Secondary | ICD-10-CM | POA: Diagnosis not present

## 2021-08-03 DIAGNOSIS — Z681 Body mass index (BMI) 19 or less, adult: Secondary | ICD-10-CM | POA: Diagnosis not present

## 2021-08-03 DIAGNOSIS — G309 Alzheimer's disease, unspecified: Secondary | ICD-10-CM | POA: Diagnosis not present

## 2021-08-03 DIAGNOSIS — I119 Hypertensive heart disease without heart failure: Secondary | ICD-10-CM | POA: Diagnosis not present

## 2021-08-05 DIAGNOSIS — I119 Hypertensive heart disease without heart failure: Secondary | ICD-10-CM | POA: Diagnosis not present

## 2021-08-05 DIAGNOSIS — R634 Abnormal weight loss: Secondary | ICD-10-CM | POA: Diagnosis not present

## 2021-08-05 DIAGNOSIS — F0280 Dementia in other diseases classified elsewhere without behavioral disturbance: Secondary | ICD-10-CM | POA: Diagnosis not present

## 2021-08-05 DIAGNOSIS — E46 Unspecified protein-calorie malnutrition: Secondary | ICD-10-CM | POA: Diagnosis not present

## 2021-08-05 DIAGNOSIS — Z681 Body mass index (BMI) 19 or less, adult: Secondary | ICD-10-CM | POA: Diagnosis not present

## 2021-08-05 DIAGNOSIS — G309 Alzheimer's disease, unspecified: Secondary | ICD-10-CM | POA: Diagnosis not present

## 2021-08-07 DIAGNOSIS — I480 Paroxysmal atrial fibrillation: Secondary | ICD-10-CM | POA: Diagnosis not present

## 2021-08-07 DIAGNOSIS — I129 Hypertensive chronic kidney disease with stage 1 through stage 4 chronic kidney disease, or unspecified chronic kidney disease: Secondary | ICD-10-CM | POA: Diagnosis not present

## 2021-08-07 DIAGNOSIS — G301 Alzheimer's disease with late onset: Secondary | ICD-10-CM | POA: Diagnosis not present

## 2021-08-07 DIAGNOSIS — E8881 Metabolic syndrome: Secondary | ICD-10-CM | POA: Diagnosis not present

## 2021-08-07 DIAGNOSIS — N1831 Chronic kidney disease, stage 3a: Secondary | ICD-10-CM | POA: Diagnosis not present

## 2021-08-08 DIAGNOSIS — I119 Hypertensive heart disease without heart failure: Secondary | ICD-10-CM | POA: Diagnosis not present

## 2021-08-08 DIAGNOSIS — R634 Abnormal weight loss: Secondary | ICD-10-CM | POA: Diagnosis not present

## 2021-08-08 DIAGNOSIS — Z681 Body mass index (BMI) 19 or less, adult: Secondary | ICD-10-CM | POA: Diagnosis not present

## 2021-08-08 DIAGNOSIS — E46 Unspecified protein-calorie malnutrition: Secondary | ICD-10-CM | POA: Diagnosis not present

## 2021-08-08 DIAGNOSIS — G309 Alzheimer's disease, unspecified: Secondary | ICD-10-CM | POA: Diagnosis not present

## 2021-08-08 DIAGNOSIS — F0280 Dementia in other diseases classified elsewhere without behavioral disturbance: Secondary | ICD-10-CM | POA: Diagnosis not present

## 2021-08-15 DIAGNOSIS — G309 Alzheimer's disease, unspecified: Secondary | ICD-10-CM | POA: Diagnosis not present

## 2021-08-15 DIAGNOSIS — E46 Unspecified protein-calorie malnutrition: Secondary | ICD-10-CM | POA: Diagnosis not present

## 2021-08-15 DIAGNOSIS — F0280 Dementia in other diseases classified elsewhere without behavioral disturbance: Secondary | ICD-10-CM | POA: Diagnosis not present

## 2021-08-15 DIAGNOSIS — I119 Hypertensive heart disease without heart failure: Secondary | ICD-10-CM | POA: Diagnosis not present

## 2021-08-15 DIAGNOSIS — Z681 Body mass index (BMI) 19 or less, adult: Secondary | ICD-10-CM | POA: Diagnosis not present

## 2021-08-15 DIAGNOSIS — R634 Abnormal weight loss: Secondary | ICD-10-CM | POA: Diagnosis not present

## 2021-08-16 DIAGNOSIS — Z681 Body mass index (BMI) 19 or less, adult: Secondary | ICD-10-CM | POA: Diagnosis not present

## 2021-08-16 DIAGNOSIS — G309 Alzheimer's disease, unspecified: Secondary | ICD-10-CM | POA: Diagnosis not present

## 2021-08-16 DIAGNOSIS — F0280 Dementia in other diseases classified elsewhere without behavioral disturbance: Secondary | ICD-10-CM | POA: Diagnosis not present

## 2021-08-16 DIAGNOSIS — I119 Hypertensive heart disease without heart failure: Secondary | ICD-10-CM | POA: Diagnosis not present

## 2021-08-16 DIAGNOSIS — E46 Unspecified protein-calorie malnutrition: Secondary | ICD-10-CM | POA: Diagnosis not present

## 2021-08-16 DIAGNOSIS — R634 Abnormal weight loss: Secondary | ICD-10-CM | POA: Diagnosis not present

## 2021-08-17 DIAGNOSIS — I119 Hypertensive heart disease without heart failure: Secondary | ICD-10-CM | POA: Diagnosis not present

## 2021-08-17 DIAGNOSIS — E46 Unspecified protein-calorie malnutrition: Secondary | ICD-10-CM | POA: Diagnosis not present

## 2021-08-17 DIAGNOSIS — F0280 Dementia in other diseases classified elsewhere without behavioral disturbance: Secondary | ICD-10-CM | POA: Diagnosis not present

## 2021-08-17 DIAGNOSIS — Z681 Body mass index (BMI) 19 or less, adult: Secondary | ICD-10-CM | POA: Diagnosis not present

## 2021-08-17 DIAGNOSIS — R634 Abnormal weight loss: Secondary | ICD-10-CM | POA: Diagnosis not present

## 2021-08-17 DIAGNOSIS — G309 Alzheimer's disease, unspecified: Secondary | ICD-10-CM | POA: Diagnosis not present

## 2021-08-20 DEATH — deceased

## 2021-11-05 IMAGING — CR DG LUMBAR SPINE COMPLETE 4+V
5 series · 5 of 5 positions shown · non-contrast
Comparison: None.

CLINICAL DATA: Fall and back pain

EXAM:
LUMBAR SPINE - COMPLETE 4+ VIEW

[l-spine ap]
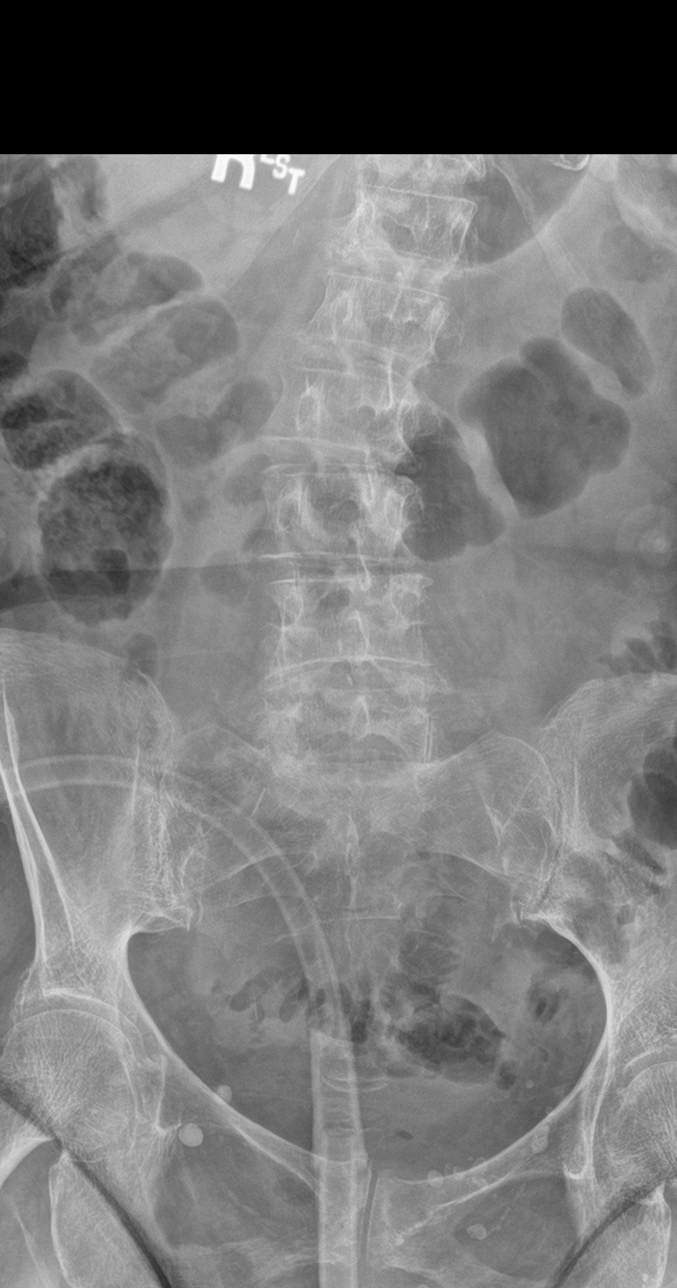

[l-spine obl (1 of 2)]
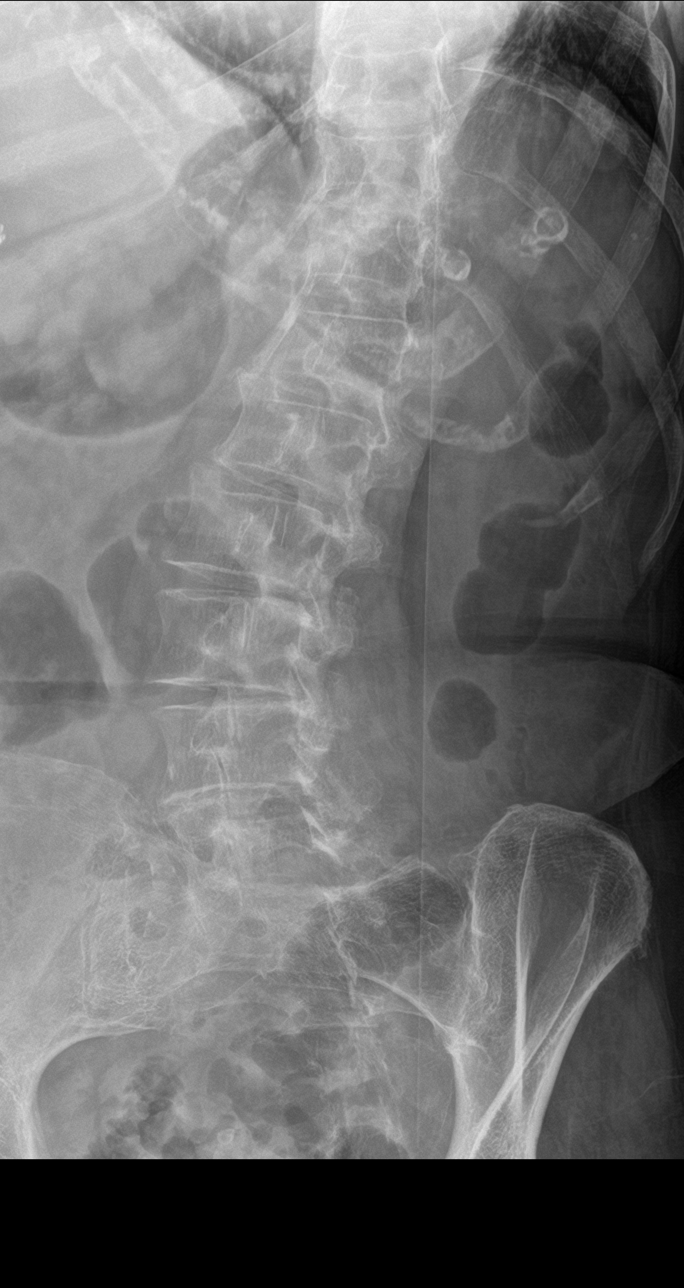

[l-spine obl (2 of 2)]
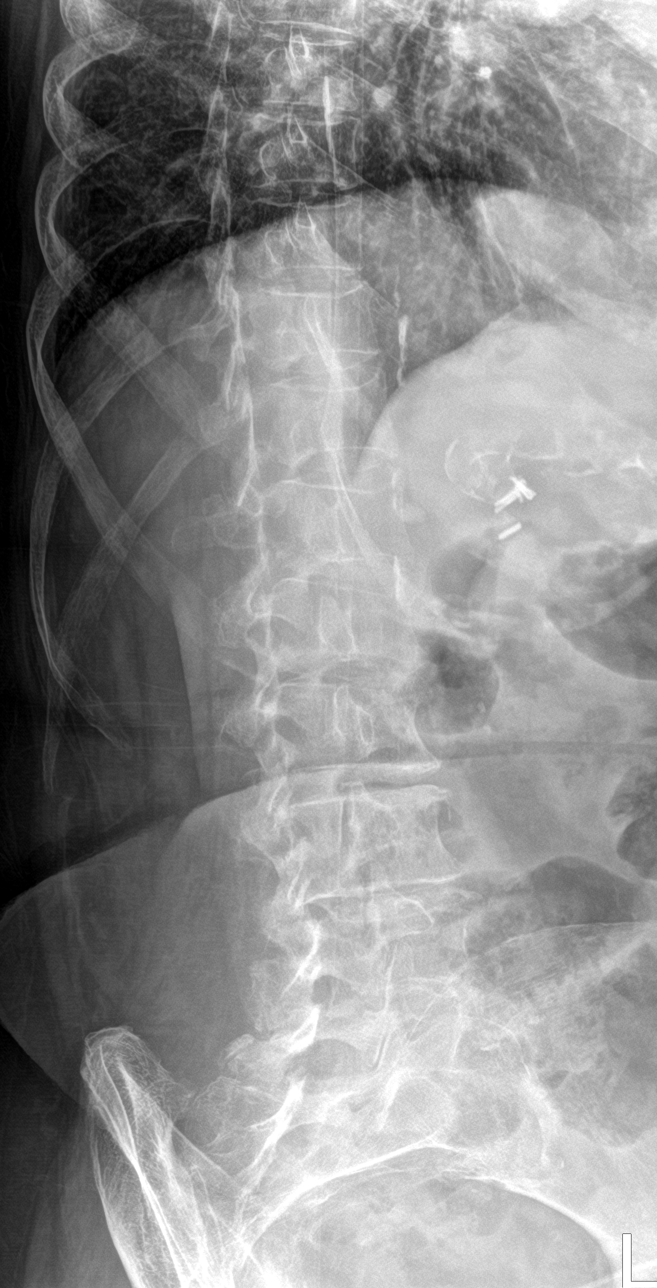

[l-spine lat]
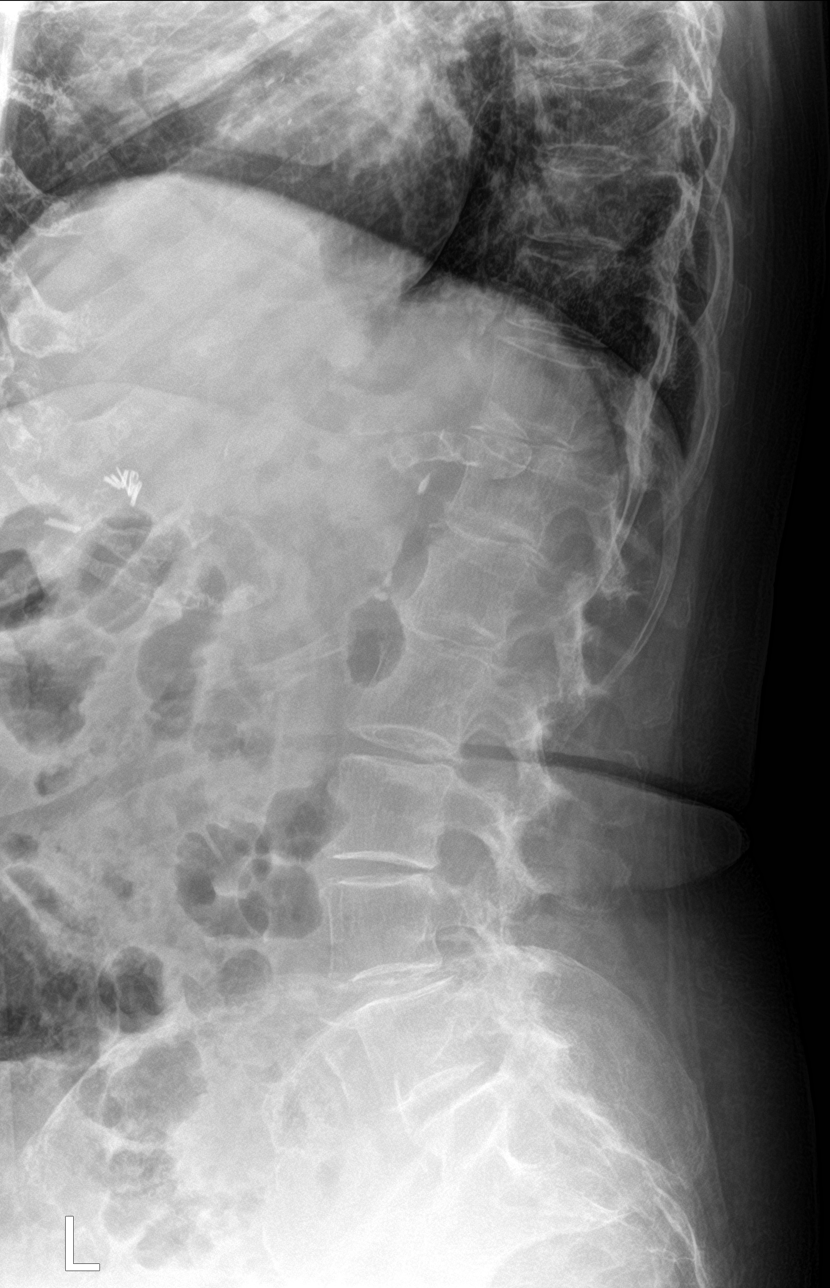

[l-spine spot]
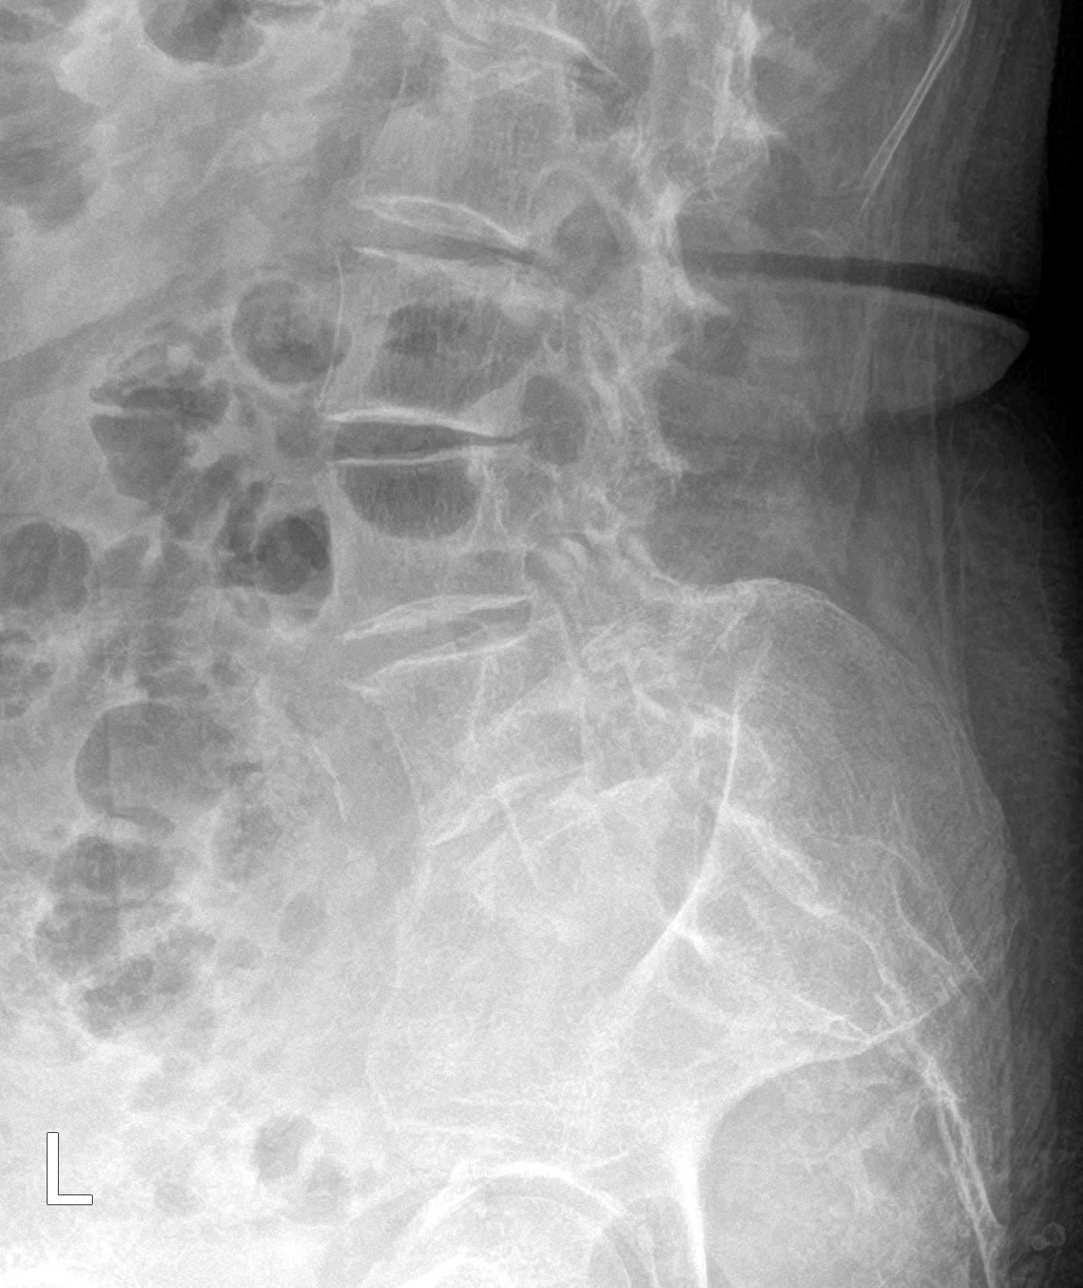

[5 of 5 positions shown; findings below may reference images not displayed]

FINDINGS: There is a mild rightward curvature of the lumbar spine. No definite
fracture is noted. There is a minimal anterolisthesis of L4 on L5.
Degenerative changes seen in the lower lumbar spine with disc height
loss and facet arthrosis. Scattered vascular calcifications are
noted.
IMPRESSION: No definite acute fracture or malalignment.
